# Patient Record
Sex: Female | Born: 1965
Health system: Southern US, Community
[De-identification: ages and names within clinical notes are randomized; demographics above are authoritative.]

## PROBLEM LIST (undated history)

## (undated) DIAGNOSIS — N939 Abnormal uterine and vaginal bleeding, unspecified: Secondary | ICD-10-CM

## (undated) DIAGNOSIS — R51 Headache: Secondary | ICD-10-CM

## (undated) DIAGNOSIS — Z8679 Personal history of other diseases of the circulatory system: Secondary | ICD-10-CM

## (undated) DIAGNOSIS — S83249A Other tear of medial meniscus, current injury, unspecified knee, initial encounter: Secondary | ICD-10-CM

## (undated) HISTORY — DX: Abnormal uterine and vaginal bleeding, unspecified: N93.9

## (undated) HISTORY — DX: Headache: R51

## (undated) HISTORY — PX: KNEE SURGERY: SHX244

## (undated) HISTORY — PX: BACK SURGERY: SHX140

## (undated) HISTORY — DX: Personal history of other diseases of the circulatory system: Z86.79

## (undated) HISTORY — PX: CHOLECYSTECTOMY: SHX55

## (undated) HISTORY — PX: APPENDECTOMY: SHX54

---

## 1999-09-29 ENCOUNTER — Ambulatory Visit (HOSPITAL_COMMUNITY): Admission: RE | Admit: 1999-09-29 | Discharge: 1999-09-29 | Payer: Self-pay | Admitting: Obstetrics and Gynecology

## 1999-09-29 ENCOUNTER — Encounter: Payer: Self-pay | Admitting: Obstetrics and Gynecology

## 2000-03-07 ENCOUNTER — Inpatient Hospital Stay (HOSPITAL_COMMUNITY): Admission: AD | Admit: 2000-03-07 | Discharge: 2000-03-09 | Payer: Self-pay | Admitting: *Deleted

## 2000-04-04 ENCOUNTER — Other Ambulatory Visit: Admission: RE | Admit: 2000-04-04 | Discharge: 2000-04-04 | Payer: Self-pay | Admitting: Obstetrics & Gynecology

## 2001-01-31 ENCOUNTER — Emergency Department (HOSPITAL_COMMUNITY): Admission: EM | Admit: 2001-01-31 | Discharge: 2001-01-31 | Payer: Self-pay | Admitting: Emergency Medicine

## 2001-02-05 ENCOUNTER — Encounter: Payer: Self-pay | Admitting: Emergency Medicine

## 2001-02-05 ENCOUNTER — Ambulatory Visit (HOSPITAL_COMMUNITY): Admission: RE | Admit: 2001-02-05 | Discharge: 2001-02-05 | Payer: Self-pay | Admitting: Emergency Medicine

## 2001-02-12 ENCOUNTER — Inpatient Hospital Stay (HOSPITAL_COMMUNITY): Admission: EM | Admit: 2001-02-12 | Discharge: 2001-02-13 | Payer: Self-pay

## 2001-02-12 ENCOUNTER — Encounter: Payer: Self-pay | Admitting: Surgery

## 2001-02-12 ENCOUNTER — Encounter (INDEPENDENT_AMBULATORY_CARE_PROVIDER_SITE_OTHER): Payer: Self-pay

## 2001-09-04 ENCOUNTER — Other Ambulatory Visit: Admission: RE | Admit: 2001-09-04 | Discharge: 2001-09-04 | Payer: Self-pay | Admitting: Obstetrics & Gynecology

## 2004-04-19 ENCOUNTER — Ambulatory Visit: Payer: Self-pay | Admitting: Internal Medicine

## 2004-04-20 ENCOUNTER — Encounter: Admission: RE | Admit: 2004-04-20 | Discharge: 2004-04-20 | Payer: Self-pay | Admitting: Internal Medicine

## 2004-08-03 ENCOUNTER — Ambulatory Visit: Payer: Self-pay | Admitting: Internal Medicine

## 2004-08-07 ENCOUNTER — Encounter: Admission: RE | Admit: 2004-08-07 | Discharge: 2004-08-07 | Payer: Self-pay | Admitting: Internal Medicine

## 2004-08-19 ENCOUNTER — Encounter: Admission: RE | Admit: 2004-08-19 | Discharge: 2004-08-19 | Payer: Self-pay | Admitting: Neurosurgery

## 2004-09-20 ENCOUNTER — Ambulatory Visit: Payer: Self-pay | Admitting: Family Medicine

## 2004-11-09 ENCOUNTER — Encounter: Admission: RE | Admit: 2004-11-09 | Discharge: 2004-11-09 | Payer: Self-pay | Admitting: Neurosurgery

## 2005-01-23 ENCOUNTER — Ambulatory Visit (HOSPITAL_COMMUNITY): Admission: RE | Admit: 2005-01-23 | Discharge: 2005-01-24 | Payer: Self-pay | Admitting: Neurosurgery

## 2006-05-01 ENCOUNTER — Ambulatory Visit: Payer: Self-pay | Admitting: Internal Medicine

## 2006-07-20 ENCOUNTER — Ambulatory Visit: Payer: Self-pay | Admitting: Internal Medicine

## 2006-07-20 LAB — CONVERTED CEMR LAB
BUN: 9 mg/dL (ref 6–23)
Basophils Relative: 0.7 % (ref 0.0–1.0)
Bilirubin, Direct: 0.1 mg/dL (ref 0.0–0.3)
CO2: 27 meq/L (ref 19–32)
Calcium: 8.7 mg/dL (ref 8.4–10.5)
Chloride: 107 meq/L (ref 96–112)
Creatinine, Ser: 0.8 mg/dL (ref 0.4–1.2)
Eosinophils Relative: 1.4 % (ref 0.0–5.0)
HDL: 37.4 mg/dL — ABNORMAL LOW (ref >39.0)
Hemoglobin: 13.6 g/dL (ref 12.0–15.0)
Lymphocytes Relative: 28.9 % (ref 12.0–46.0)
Monocytes Relative: 7.5 % (ref 3.0–11.0)
Neutro Abs: 4.5 10*3/uL (ref 1.4–7.7)
Neutrophils Relative %: 61.5 % (ref 43.0–77.0)
Platelets: 388 10*3/uL (ref 150–400)
Potassium: 4.4 meq/L (ref 3.5–5.1)
RBC: 4.43 M/uL (ref 3.87–5.11)
Total Protein: 6.8 g/dL (ref 6.0–8.3)
WBC: 7.3 10*3/uL (ref 4.5–10.5)

## 2006-07-21 ENCOUNTER — Encounter: Payer: Self-pay | Admitting: Internal Medicine

## 2006-07-23 DIAGNOSIS — K3189 Other diseases of stomach and duodenum: Secondary | ICD-10-CM

## 2006-07-23 DIAGNOSIS — R51 Headache: Secondary | ICD-10-CM

## 2006-07-23 DIAGNOSIS — R519 Headache, unspecified: Secondary | ICD-10-CM | POA: Insufficient documentation

## 2006-07-23 DIAGNOSIS — R1013 Epigastric pain: Secondary | ICD-10-CM

## 2006-07-26 ENCOUNTER — Encounter: Admission: RE | Admit: 2006-07-26 | Discharge: 2006-07-26 | Payer: Self-pay | Admitting: Internal Medicine

## 2006-08-02 ENCOUNTER — Telehealth (INDEPENDENT_AMBULATORY_CARE_PROVIDER_SITE_OTHER): Payer: Self-pay | Admitting: *Deleted

## 2006-08-03 ENCOUNTER — Telehealth (INDEPENDENT_AMBULATORY_CARE_PROVIDER_SITE_OTHER): Payer: Self-pay | Admitting: *Deleted

## 2006-08-03 ENCOUNTER — Ambulatory Visit: Payer: Self-pay | Admitting: Internal Medicine

## 2006-08-03 ENCOUNTER — Ambulatory Visit: Payer: Self-pay | Admitting: Cardiology

## 2006-08-03 LAB — CONVERTED CEMR LAB
Bilirubin Urine: NEGATIVE
Specific Gravity, Urine: 1.023 (ref 1.005–1.03)
Urine Glucose: NEGATIVE mg/dL
Urobilinogen, UA: 0.2 (ref 0.0–1.0)
WBC, UA: NONE SEEN cells/hpf (ref <3)
pH: 5.5 (ref 5.0–8.0)

## 2007-11-01 ENCOUNTER — Ambulatory Visit: Payer: Self-pay | Admitting: Internal Medicine

## 2008-01-27 ENCOUNTER — Telehealth (INDEPENDENT_AMBULATORY_CARE_PROVIDER_SITE_OTHER): Payer: Self-pay | Admitting: *Deleted

## 2008-01-28 ENCOUNTER — Ambulatory Visit: Payer: Self-pay | Admitting: Internal Medicine

## 2008-11-04 ENCOUNTER — Ambulatory Visit: Payer: Self-pay | Admitting: Diagnostic Radiology

## 2008-11-04 ENCOUNTER — Emergency Department (HOSPITAL_BASED_OUTPATIENT_CLINIC_OR_DEPARTMENT_OTHER): Admission: EM | Admit: 2008-11-04 | Discharge: 2008-11-04 | Payer: Self-pay | Admitting: Emergency Medicine

## 2009-01-05 ENCOUNTER — Encounter: Payer: Self-pay | Admitting: Internal Medicine

## 2010-01-28 ENCOUNTER — Encounter: Payer: Self-pay | Admitting: Internal Medicine

## 2010-04-05 NOTE — Letter (Signed)
Summary: Call a Nurse  Call a Nurse   Imported By: Lanelle Bal 02/09/2010 14:27:51  _____________________________________________________________________  External Attachment:    Type:   Image     Comment:   External Document

## 2010-06-10 LAB — CBC
Hemoglobin: 13.3 g/dL (ref 12.0–15.0)
MCV: 90.6 fL (ref 78.0–100.0)
Platelets: 360 10*3/uL (ref 150–400)
RBC: 4.21 MIL/uL (ref 3.87–5.11)

## 2010-06-10 LAB — POCT CARDIAC MARKERS
CKMB, poc: <1 ng/mL — ABNORMAL LOW (ref 1.0–8.0)
Myoglobin, poc: 44.3 ng/mL (ref 12–200)
Myoglobin, poc: 45 ng/mL (ref 12–200)
Troponin i, poc: <0.05 ng/mL (ref 0.00–0.09)

## 2010-06-10 LAB — BASIC METABOLIC PANEL
BUN: 15 mg/dL (ref 6–23)
CO2: 27 mEq/L (ref 19–32)
Calcium: 9.2 mg/dL (ref 8.4–10.5)
Chloride: 103 mEq/L (ref 96–112)
Creatinine, Ser: 0.9 mg/dL (ref 0.4–1.2)
GFR calc Af Amer: >60 mL/min (ref >60)
Glucose, Bld: 89 mg/dL (ref 70–99)
Sodium: 140 mEq/L (ref 135–145)

## 2010-06-10 LAB — DIFFERENTIAL
Basophils Absolute: 0.2 10*3/uL — ABNORMAL HIGH (ref 0.0–0.1)
Monocytes Relative: 7 % (ref 3–12)
Neutro Abs: 5.2 10*3/uL (ref 1.7–7.7)

## 2010-07-11 ENCOUNTER — Ambulatory Visit (INDEPENDENT_AMBULATORY_CARE_PROVIDER_SITE_OTHER): Payer: Managed Care, Other (non HMO) | Admitting: Family Medicine

## 2010-07-11 ENCOUNTER — Encounter: Payer: Self-pay | Admitting: *Deleted

## 2010-07-11 VITALS — BP 124/80 | Temp 98.8°F | Wt 294.4 lb

## 2010-07-11 DIAGNOSIS — J329 Chronic sinusitis, unspecified: Secondary | ICD-10-CM | POA: Insufficient documentation

## 2010-07-11 MED ORDER — GUAIFENESIN-CODEINE 100-10 MG/5ML PO SYRP: 5.0000 mL | ORAL_SOLUTION | Freq: Four times a day (QID) | ORAL | Status: AC | PRN

## 2010-07-11 MED ORDER — BENZONATATE 200 MG PO CAPS: 200.0000 mg | ORAL_CAPSULE | Freq: Three times a day (TID) | ORAL | Status: AC | PRN

## 2010-07-11 MED ORDER — AMOXICILLIN 875 MG PO TABS: 875.0000 mg | ORAL_TABLET | Freq: Two times a day (BID) | ORAL | Status: AC

## 2010-07-11 NOTE — Patient Instructions (Signed)
This is a sinus infection w/ a L ear infection Take the Amoxicillin as directed- take w/ food to avoid upset stomach Use the cough meds as needed- the pills won't make you drowsy, the syrup will Drink plenty of fluids Ibuprofen for pain and fever REST! Hang in there!

## 2010-07-11 NOTE — Assessment & Plan Note (Signed)
Pt's PE consistent w/ sinus infxn and likely OM on L.  Start Amox.  Cough meds prn.  Reviewed supportive care and red flags that should prompt return.  Pt expressed understanding and is in agreement w/ plan.

## 2010-07-11 NOTE — Progress Notes (Signed)
  Subjective:    Patient ID: Shelley Edwards, female    DOB: 08/18/1965, 45 y.o.   MRN: [id number]  HPI URI- sxs started 6 days ago w/ runny nose, nasal congestion.  This weekend developed L ear pain, HA, eye pain.  + facial pressure.  + cough- productive w/ post tussive emesis.  Subjective fever.  + sick contacts.                                                                                  Review of Systems For ROS see HPI     Objective:   Physical Exam  Constitutional: She appears well-developed and well-nourished. No distress.  HENT:  Head: Normocephalic and atraumatic.  Right Ear: Tympanic membrane normal.  Left Ear: Tympanic membrane is erythematous and retracted.  Nose: Mucosal edema and rhinorrhea present. Right sinus exhibits no maxillary sinus tenderness and no frontal sinus tenderness. Left sinus exhibits maxillary sinus tenderness and frontal sinus tenderness.  Mouth/Throat: Uvula is midline and mucous membranes are normal. Posterior oropharyngeal erythema present. No oropharyngeal exudate.  Eyes: Conjunctivae and EOM are normal. Pupils are equal, round, and reactive to light.  Neck: Normal range of motion. Neck supple.  Cardiovascular: Normal rate, regular rhythm and normal heart sounds.   Pulmonary/Chest: Effort normal and breath sounds normal. No respiratory distress. She has no wheezes.  Lymphadenopathy:    She has no cervical adenopathy.          Assessment & Plan:

## 2010-07-22 NOTE — Op Note (Signed)
Shelley Edwards, Shelley Edwards               ACCOUNT NO.:  [id number]   MEDICAL RECORD NO.:  [id number]          PATIENT TYPE:  AMB   LOCATION:  SDS                          FACILITY:  [facility]   PHYSICIAN:  Henry A. Pool, M.D.    DATE OF BIRTH:  August 07, 1965   DATE OF PROCEDURE:  01/23/2005  DATE OF DISCHARGE:                                 OPERATIVE REPORT   ATTENDING PHYSICIAN:  Sherilyn Cooter A. Pool, M.D.   SERVICE:  Neurosurgery.   PREOPERATIVE DIAGNOSES:  Left T10-T11 herniated nucleus pulposus with  myelopathy.  Left T11-T12 herniated nucleus pulposus with myelopathy.   POSTOPERATIVE DIAGNOSES:  Left T10-T11 herniated nucleus pulposus with  myelopathy.  Left T11-T12 herniated nucleus pulposus with myelopathy.   PROCEDURE NAME:  Left T10-T11 transpedicular microdiskectomy and left T11-  T12 transpedicular microdiskectomy.   ASSISTANT:  Yetta Barre.   ANESTHESIA:  General endotracheal.   INDICATIONS:  Ms. Scalici is a 45 year old female with history of back and  bilateral lower extremity pain and paresthesias consistent with a lower  thoracic myelopathy.  Workup demonstrated evidence of a large left-sided T10-  11 disc herniation with marked spinal cord compression as well as a  moderately large left paracentral disc herniation at T11-12 with spinal cord  compression.  The patient has been counseled as to her options.  She decided  to proceed with a two-level transpedicular microdiskectomy in hopes of  improving her symptoms.   OPERATIVE NOTE:  The patient was brought to the operating room and placed on  the operating table in supine position.  After __________  was achieved, the  patient was turned prone onto a Wilson frame, appropriately padding and  positioning __________  prepped and draped sterilely as was her thoracic  region.  A linear skin incision was then made over the T10, 11 and 12  levels.  This was carried down sharply in the midline.  Subperiosteal  dissection was then performed  exposing the lamina and facet joints of T10,  T11 and T12.  Deep self-retaining retractor was placed.  X-rays taken,  levels were confirmed.  Laminotomies were then performed using high-speed  drill and Kerrison rongeurs to remove the inferior aspect of the lamina  above, medial aspect of the facet joint and superior rim of the lamina  below.  The facetectomies were then carried out more widely at T10-11 and  T11-12.  The lamina of T11 was then completely resected using Kerrison  rongeurs on the patient's left side, performing a complete left  hemilaminectomy.  The pedicles of T11 and T12 were then drilled down flush  to the vertebral bodies.  Microscope was brought into the field to use  throughout the remainder of the diskectomy.  The pedicle was then further  drilled into the disc space and into the body itself.  The calcified disc  herniation was then pushed downward and then resected using pituitary  rongeurs.  This allowed removal of the disc herniation without having to  retract the thecal sac or spinal cord.  All the disc herniation at T10-11  was dissected this way quite easily.  The disc herniation at T11-12 was  somewhat more difficult as it was more midline.  This was gradually broken  off and pushed into the disc space cavity using Epstein curets and surgical  __________  dissectors.  After all the disc fragments had been completely  resected, the thecal sac and nerve roots were inspected.  They were free  from injury.  There was no residual compression.  The wound was then  irrigated with antibiotic solution.  Gelfoam was placed topically for  hemostasis and found to be good.  The microscope and dressings were removed.  Hemostasis was achieved with electrocautery.  The wound was then closed in  layers with Vicryl sutures.  Steri-Strips and a sterile dressing were  applied.  There were no operative complications.  The patient tolerated the  procedure well and she returned to  recovery postoperatively in good  condition.           ______________________________  Kathaleen Maser Pool, M.D.     HAP/MEDQ  D:  01/23/2005  T:  01/23/2005  Job:  [id number]

## 2010-07-22 NOTE — Op Note (Signed)
[facility]  Patient:    Shelley Edwards, Shelley Edwards                        MRN: [id number] Proc. Date: 03/07/00 Attending:  Caralyn Guile. Arlyce Dice, M.D.                           Operative Report  PREOPERATIVE DIAGNOSIS:       Suspected fetal macrosomia.  POSTOPERATIVE DIAGNOSIS:      Fetal macrosomia.  OPERATION:                    Primary low transverse cesarean section.  SURGEON:                      Richard D. Arlyce Dice, M.D.  ASSISTANT:                    Luvenia Redden, M.D.  ANESTHESIA:                   Spinal anesthesia.  ESTIMATED BLOOD LOSS:         500 cc.  FINDINGS:                     Female infant with Apgar scores 9 and 9, birth weight 10 pounds 6 ounces, clear amniotic fluid, normal-appearing tubes, ovaries and uterus.  INDICATION FOR PROCEDURE:     This is a 45 year old gravida 4, para 1, who presented to the office at [redacted] weeks gestation with closed cervix and a floating presenting part and an ultrasound estimated fetal weight of over 4500 grams.  The risks of induction or even spontaneous labor and delivery with a macrosomic baby were discussed with the patient who incidentally had a very difficult time delivering her first baby vaginally. Therefore, the patient and her husband were very agreeable to the option of preceding with primary cesarean section.  DESCRIPTION OF PROCEDURE:     The patient was taken to the operating room where spinal anesthesia was placed.  She was then placed in the supine position with left lateral displacement of the uterus. The abdomen was prepped and draped in sterile fashion.  The bladder was catheterized.  A low transverse incision was made and carried down to the fascia which was extended transversely. The rectus sheath was dissected from the overlying rectus muscle and then entered in the midline. The peritoneal cavity was then entered sharply and extended vertically.  The lower segment was then  identified. Incision was made through the lower segment to the amnion and then this was entered and the lower segment of the uterus was extended transversely with bandage scissors.  The infant was delivered with the aid of the kiwi vacuum. The cord bloods were obtained.  The placenta was then manually extracted.  The uterus was bluntly curettaged.  The lower segment was then closed with a single layer of running interlocking 0 Vicryl suture.  The peritoneum and rectus muscle were then closed in the midline with a running 0 Vicryl suture. The fascia was then closed with running 0 Vicryl suture.  Because of the depth of the subcutaneous tissue, interrupted 3-0 Vicryl sutures were used to close the dead space and then the skin was closed with staples.  The patient tolerated the procedure well and left the operating room in good condition. DD:  03/07/00 TD:  03/07/00  Job: [id number]

## 2010-07-22 NOTE — Discharge Summary (Signed)
[facility]  Patient:    Shelley Edwards, Shelley Edwards                        MRN: [id number] Adm. Date:  [id number] Disc. Date: [id number] Attending:  Lars Pinks Dictator:   Leilani Able, P.A.                           Discharge Summary  FINAL DIAGNOSES:              Intrauterine pregnancy at [redacted] weeks gestation, fetal macrosomia.  PROCEDURE:                    Primary low transverse cesarean section.  SURGEON:                      Richard D. Arlyce Dice, M.D.  ASSISTANT:                    Luvenia Redden, M.D.  COMPLICATIONS:                None.  HISTORY:                      This 45 year old G4, P1 presents to the office at [redacted] weeks gestation with a closed cervix and floating presenting part.  An ultrasound estimated the fetal weight over 4500 g.  The risks of induction even spontaneous labor and delivery were discussed with the patient. Therefore, the patient and her husband were agreeable to proceed with a cesarean section.  Patient was taken to the operating room by Dr. Ilda Mori where a primary low transverse cesarean section was performed with the delivery of a 10 pound 6 ounce female infant with Apgars of 8 and 9.  Delivery went without complications.  Patients postoperative course was benign without significant fever.  Patient was felt ready for discharge after postoperative day #2.  She was sent home on a regular diet, told to decrease activities, told to continue prenatal vitamins and FeSo4.  Was given Tylox #20 one to two q.4h. as needed for pain.  Told she could use over-the-counter pain medicines. Follow-up in the office in four weeks. DD:  04/04/00 TD:  04/04/00 Job: [id number]

## 2010-07-22 NOTE — Op Note (Signed)
[facility]  Patient:    Shelley Edwards, Shelley Edwards Visit Number: [id number] MRN: [id number]          Service Type: SUR Location: 4E 0401 01 Attending Physician:  Katha Cabal Dictated by:   [place] Daphine Deutscher, M.D. Proc. Date: 02/12/01 Admit Date:  02/12/2001                             Operative Report  PREOPERATIVE DIAGNOSIS:  Acute cholecystitis.  POSTOPERATIVE DIAGNOSIS:  Acute cholecystitis with normal intraoperative cholangiogram.  PROCEDURE:  Laparoscopic cholecystectomy with intraoperative cholangiogram.  DESCRIPTION OF PROCEDURE:  SURGEON:  Matthew B. Daphine Deutscher, M.D.  ASSISTANT:  Gita Kudo, M.D.  ANESTHESIA:  General endotracheal.  DESCRIPTION OF PROCEDURE:  Shelley Edwards is a 45 year old lady, native of [place], who I saw in the office on December 3 after an attack of right upper quadrant abdominal pain. Ultrasound revealed gallstones and sludge and she came to the office and informed consent was obtained regarding laparoscopic as well as open cholecystectomy.  The patient was taken to Room 11 on February 12, 2001 and given general anesthesia. Abdomen was prepped with Betadine and draped sterilely. She received one gram of Ancef preoperatively. The abdomen, after prepping and draping, made a longitudinal incision down into the umbilicus and through a pursestring suture I inserted Hasson cannula. The abdomen was insufflated and three trocars were placed in the upper abdomen. The gallbladder was markedly distended and edematous. It was decompressed and it contained dark black bile. There appeared to be a large stone impacted in the cystic duct. We elevated the gallbladder and I dissected out Calots triangle and found a very plump cystic duct. I put a clip up on the gallbladder side, incised the cystic duct, and inserted a Reddick catheter.  A dynamic cholangiogram revealed a fairly long cystic duct that came down and twisted  over the common duct with retrograde filling up into the intrahepatic radicals and then distal nice tapering and free flow into the duodenum. I then triple clipped the cystic duct, divided it, found a significant cystic artery right on the gallbladder which I triple clipped and divided and then in a very tedious manner, removed the gallbladder from the gallbladder bed. This was due to the marked amount of edema in the gallbladder wall and the fact that it was a very intrahepatic gallbladder. We maintained hemostasis throughout the dissection. No bowel leaks were noted, and once detached, the gallbladder was placed in a bag and brought out through the umbilicus with some difficulty and then I had to enlarge the incision significantly to get this out. Once removed, we went back and irrigated the gallbladder bed. No bleeding or bowel leaks were noted. I then repaired the umbilical defect under direct vision with the scope with interrupted simple sutures of 0 Vicryl and also with using the Endoclose device and a 0 Prolene. Once completed, I injected all the wound sites with Marcaine, and closed the skin with 4-0 Vicryl. Benzoin and Steri-Strips were used on the skin. The patient was awakened and taken to the recovery room in satisfactory condition.  FINAL DIAGNOSIS:  Acute cholecystitis, status post laparoscopic cholecystectomy. Dictated by:   [place] Daphine Deutscher, M.D. Attending Physician:  Katha Cabal DD:  02/12/01 TD:  02/12/01 Job: [id number]

## 2010-10-24 ENCOUNTER — Other Ambulatory Visit: Payer: Self-pay | Admitting: Orthopedic Surgery

## 2010-10-24 DIAGNOSIS — M25561 Pain in right knee: Secondary | ICD-10-CM

## 2010-11-08 ENCOUNTER — Other Ambulatory Visit: Payer: Managed Care, Other (non HMO)

## 2011-07-17 ENCOUNTER — Encounter: Payer: Managed Care, Other (non HMO) | Admitting: Internal Medicine

## 2011-08-08 ENCOUNTER — Encounter: Payer: Self-pay | Admitting: Internal Medicine

## 2011-08-08 ENCOUNTER — Ambulatory Visit (INDEPENDENT_AMBULATORY_CARE_PROVIDER_SITE_OTHER): Payer: BC Managed Care – PPO | Admitting: Internal Medicine

## 2011-08-08 VITALS — BP 136/88 | HR 85 | Temp 98.0°F | Wt 311.0 lb

## 2011-08-08 DIAGNOSIS — F419 Anxiety disorder, unspecified: Secondary | ICD-10-CM

## 2011-08-08 DIAGNOSIS — Z13220 Encounter for screening for lipoid disorders: Secondary | ICD-10-CM

## 2011-08-08 DIAGNOSIS — F411 Generalized anxiety disorder: Secondary | ICD-10-CM

## 2011-08-08 DIAGNOSIS — R079 Chest pain, unspecified: Secondary | ICD-10-CM

## 2011-08-08 DIAGNOSIS — R739 Hyperglycemia, unspecified: Secondary | ICD-10-CM

## 2011-08-08 DIAGNOSIS — R609 Edema, unspecified: Secondary | ICD-10-CM

## 2011-08-08 DIAGNOSIS — R7309 Other abnormal glucose: Secondary | ICD-10-CM

## 2011-08-08 DIAGNOSIS — E669 Obesity, unspecified: Secondary | ICD-10-CM

## 2011-08-08 LAB — LIPID PANEL: Triglycerides: 247 mg/dL — ABNORMAL HIGH (ref 0.0–149.0)

## 2011-08-08 LAB — CBC WITH DIFFERENTIAL/PLATELET
Basophils Relative: 0.7 % (ref 0.0–3.0)
Eosinophils Relative: 2.5 % (ref 0.0–5.0)
Hemoglobin: 12.4 g/dL (ref 12.0–15.0)
Lymphocytes Relative: 23.9 % (ref 12.0–46.0)
Lymphs Abs: 1.8 10*3/uL (ref 0.7–4.0)
Monocytes Absolute: 0.5 10*3/uL (ref 0.1–1.0)
Monocytes Relative: 7 % (ref 3.0–12.0)
Neutro Abs: 4.8 10*3/uL (ref 1.4–7.7)
Neutrophils Relative %: 65.9 % (ref 43.0–77.0)
RBC: 4.37 Mil/uL (ref 3.87–5.11)
RDW: 16.2 % — ABNORMAL HIGH (ref 11.5–14.6)
WBC: 7.3 10*3/uL (ref 4.5–10.5)

## 2011-08-08 LAB — COMPREHENSIVE METABOLIC PANEL
ALT: 34 U/L (ref 0–35)
Albumin: 3.4 g/dL — ABNORMAL LOW (ref 3.5–5.2)
Alkaline Phosphatase: 61 U/L (ref 39–117)
CO2: 26 mEq/L (ref 19–32)
Calcium: 8.5 mg/dL (ref 8.4–10.5)
Creatinine, Ser: 0.8 mg/dL (ref 0.4–1.2)
GFR: 84.83 mL/min (ref >60.00)
Potassium: 4.2 mEq/L (ref 3.5–5.1)
Total Bilirubin: 0.4 mg/dL (ref 0.3–1.2)

## 2011-08-08 LAB — HEMOGLOBIN A1C: Hgb A1c MFr Bld: 6.3 % (ref 4.6–6.5)

## 2011-08-08 LAB — TSH: TSH: 1.38 u[IU]/mL (ref 0.35–5.50)

## 2011-08-08 LAB — T3, FREE: T3, Free: 2.9 pg/mL (ref 2.3–4.2)

## 2011-08-08 NOTE — Patient Instructions (Signed)
Please get your x-ray at the other [facility]  office located at: [address], across from [facility].  Please go to the basement, this is a walk-in facility, they are open from 8:30 to 5:30 PM. Phone number [phone].  

## 2011-08-08 NOTE — Progress Notes (Signed)
Subjective:    Patient ID: Shelley Edwards, female    DOB: 08/22/1965, 46 y.o.   MRN: [id number]  HPI Here for a physical however she has multiple complaints. Will focus on her complaints and asked her to come back another day for a physical.  2 years history is insomnia, no problems falling asleep but she cannot stay asleep. Over-the-counter "Motrin nighttime" does help sometimes.  Also complains of joint aches, "every joint aches at different times"; denies headaches or weight loss.  Occasionally her hands are puffy (she points today to her knuckles, on exam they are not puffy)  Severe feet swelling at the end of the day.  She feels bloated all the time, not just after eating. Denies any nausea, vomiting. No diarrhea or change in the color of the stools. Very seldom she has GERD symptoms. Denies any postprandial right upper quadrant pain or epigastric pain. Occasionally pain at the left upper quadrant.  For the last 2 months she has been eating very healthy and is walking 3 miles daily despite that, she is unable to lose weight.  Finally she reports occasional chest pain, CP usually triggered by stress and being upset at work, pain is located in the middle of the chest, some  Radiation to the L shoulder, occ arm arm gets numb, sometimes associated with shortness of breath; gets sweatty, dizzy, no nauseous; pain may last 2 days sometimes, usually decreased after she takes aspirin.  Past Medical History: Headache Horseshoe  kidney per CT 2008 Fatty liver per CT 2008 BC-- none   Past Surgical History: knee surgery back surgery cholecystectomy  Social History: Married,  2 kids Tobacco-- rarely ETOH-- socially   Family History  MI-- F at age 59 Stroke-- GF GM Colon ca--no Breast ca--no  Review of Systems See history of present illness Denies fever or chills. Periods are regular. Admits to heavy snoring. Feels extremely fatigued some days, no actual inappropriate falling  asleep. She admits to anxiety and depression. Anxiety related to marital issues > obesity >  work , + depression, no suicidal ideas     Objective:   Physical Exam General -- alert, well-developed, and overweight appearing. No apparent distress.  Neck --no thyromegaly Lungs -- normal respiratory effort, no intercostal retractions, no accessory muscle use, and normal breath sounds.   Heart-- normal rate, regular rhythm, no murmur, and no gallop.   Abdomen--soft, non-tender, no distention, no masses, no HSM, no guarding, and no rigidity.   Extremities-- no pretibial edema bilaterally ; wrist and hand inspection and palpation with no synovitis Neurologic-- alert & oriented X3 and strength normal in all extremities. Psych-- Cognition and judgment appear intact. Alert and cooperative with normal attention span and concentration.  not anxious  but seems to be upset- angry in general     Assessment & Plan:   Presents today with multiple symptoms: Insomnia, responding well to over-the-counter medicines Joint aches, no evidence of synovitis on exam Lower extremity edema, no edema on today's exam, likely related to obesity Dyspepsia, will check for a CBC, may need further evaluation. Obesity and inability to lose weight: We had a  conversation about diet, she is reluctant to see a dietitian. "I've seen them twice, I ended up gaining weight", also recommend a calorie counting, "I'm following a 1500-calorie diet, I know how to count calories ". Eventually I recommend to read a book [employer]. Will order a TSH and also a T3 and T4 per patient request Chest pain,  will order a stress test and a chest x-ray Anxiety depression: we discussed personal and marital counseling. States her husband won't do  counseling, she does  has resources and is willing to set up personal counseling. We discussed medications and she is reluctant to take any. As far as question of  sleep apnea (snoring, fatigue), we  discussed possibly a sleep study, she says she's not ready to use any sort of device like a CPAP consequently we'll focus on weight loss.  Today , I spent more than 42 min with the patient, >50% of the time counseling and evaluating her multiple symptoms

## 2011-08-30 ENCOUNTER — Other Ambulatory Visit (HOSPITAL_COMMUNITY): Payer: BC Managed Care – PPO

## 2011-09-28 ENCOUNTER — Ambulatory Visit: Payer: BC Managed Care – PPO | Admitting: Internal Medicine

## 2012-03-13 ENCOUNTER — Encounter: Payer: Self-pay | Admitting: Internal Medicine

## 2012-03-13 ENCOUNTER — Ambulatory Visit (INDEPENDENT_AMBULATORY_CARE_PROVIDER_SITE_OTHER): Payer: BC Managed Care – PPO | Admitting: Internal Medicine

## 2012-03-13 VITALS — BP 130/82 | HR 88 | Temp 98.3°F | Wt 312.0 lb

## 2012-03-13 DIAGNOSIS — R109 Unspecified abdominal pain: Secondary | ICD-10-CM

## 2012-03-13 DIAGNOSIS — R0789 Other chest pain: Secondary | ICD-10-CM

## 2012-03-13 DIAGNOSIS — R071 Chest pain on breathing: Secondary | ICD-10-CM

## 2012-03-13 LAB — POCT URINALYSIS DIPSTICK
Bilirubin, UA: NEGATIVE
Leukocytes, UA: NEGATIVE
Spec Grav, UA: >=1.03
pH, UA: 5

## 2012-03-13 NOTE — Progress Notes (Signed)
  Subjective:    Patient ID: SHAHD OCCHIPINTI, female    DOB: 06-25-1965, 47 y.o.   MRN: [id number]  HPI Acute visit On and off pain in the left upper quadrant/left lower chest for the last 2 or 3 weeks. Symptoms are worse by lifting or doing home chores. Pain is not worse by eating. Ibuprofen and Tylenol offered little help. Denies any rash or injury. Will further questioning, she had flulike symptoms 3 weeks ago and she had severe cough x 2 weeks. Flu symptoms are now resolved.  Past Medical History: Headache Horseshoe  kidney per CT 2008 Fatty liver per CT 2008 BC-- none   Past Surgical History: knee surgery back surgery cholecystectomy  Social History: Married,  2 kids Tobacco-- rarely ETOH-- socially      Review of Systems No nausea, vomiting, diarrhea. No dysuria gross hematuria No recent airplane trips. No shortness of breath and again cough is resolved.     Objective:   Physical Exam  Abdominal:      General -- alert, well-developed  HEENT -- not pale or icteric  Lungs -- normal respiratory effort, no intercostal retractions, no accessory muscle use, and normal breath sounds.   Heart-- normal rate, regular rhythm, no murmur, and no gallop.   Abdomen--se graphic; no organomegaly, rest of the abdomen is soft and nontender. Extremities-- no pretibial edema bilaterally  Psych-- Cognition and judgment appear intact. Alert and cooperative with normal attention span and concentration.  not anxious appearing and not depressed appearing.      Assessment & Plan:

## 2012-03-13 NOTE — Assessment & Plan Note (Signed)
Left lower chest and left upper quadrant abdominal pain, given the location and history of recent intense cough i suspect a chest wall sprain or a rib fracture. When she came in we did a urine dip because of left upper abd quadrant pain, it showed trace of blood, UCX sent but I doubt she has a kidney stone or infex. Plan: Chest x-ray Motrin Wait x 2-3  weeks, will call if not improving

## 2012-03-13 NOTE — Patient Instructions (Addendum)
Get a chest XR at [facility], corner [address] (10 minutes form here); they are open 24/7 [address] [facility]  [place], [place] [address] [phone] ---- Motrin 200 mg 3 tablets every 6 hours as needed for pain. Always take it with food. Watch for stomach side effects (gastritis): nausea, stomach pain, change in the color of stools. Call if no better in 2-3 weeks

## 2012-03-15 LAB — URINE CULTURE: Colony Count: 60000

## 2012-03-21 ENCOUNTER — Encounter: Payer: Self-pay | Admitting: *Deleted

## 2014-01-05 ENCOUNTER — Encounter: Payer: Self-pay | Admitting: Internal Medicine

## 2014-06-24 ENCOUNTER — Ambulatory Visit (HOSPITAL_BASED_OUTPATIENT_CLINIC_OR_DEPARTMENT_OTHER)
Admission: RE | Admit: 2014-06-24 | Discharge: 2014-06-24 | Disposition: A | Discharge status: Home / Self Care | Payer: BLUE CROSS/BLUE SHIELD | Source: Ambulatory Visit | Attending: Internal Medicine | Admitting: Internal Medicine

## 2014-06-24 ENCOUNTER — Telehealth: Payer: Self-pay

## 2014-06-24 ENCOUNTER — Encounter: Payer: Self-pay | Admitting: Internal Medicine

## 2014-06-24 ENCOUNTER — Ambulatory Visit (INDEPENDENT_AMBULATORY_CARE_PROVIDER_SITE_OTHER): Payer: BLUE CROSS/BLUE SHIELD | Admitting: Internal Medicine

## 2014-06-24 ENCOUNTER — Telehealth: Payer: Self-pay | Admitting: *Deleted

## 2014-06-24 VITALS — BP 124/68 | HR 64 | Temp 98.1°F | Wt 281.0 lb

## 2014-06-24 DIAGNOSIS — R937 Abnormal findings on diagnostic imaging of other parts of musculoskeletal system: Secondary | ICD-10-CM | POA: Insufficient documentation

## 2014-06-24 DIAGNOSIS — R109 Unspecified abdominal pain: Secondary | ICD-10-CM

## 2014-06-24 DIAGNOSIS — R1010 Upper abdominal pain, unspecified: Secondary | ICD-10-CM | POA: Diagnosis present

## 2014-06-24 LAB — CBC WITH DIFFERENTIAL/PLATELET
BASOS ABS: 0 10*3/uL (ref 0.0–0.1)
BASOS PCT: 0 % (ref 0–1)
Eosinophils Absolute: 0.2 10*3/uL (ref 0.0–0.7)
Eosinophils Relative: 2 % (ref 0–5)
HCT: 35.2 % — ABNORMAL LOW (ref 36.0–46.0)
Hemoglobin: 11.5 g/dL — ABNORMAL LOW (ref 12.0–15.0)
LYMPHS PCT: 30 % (ref 12–46)
Lymphs Abs: 2.4 10*3/uL (ref 0.7–4.0)
MCH: 27.7 pg (ref 26.0–34.0)
MCHC: 32.7 g/dL (ref 30.0–36.0)
MCV: 84.8 fL (ref 78.0–100.0)
MPV: 8.7 fL (ref 8.6–12.4)
Monocytes Absolute: 0.8 10*3/uL (ref 0.1–1.0)
Monocytes Relative: 10 % (ref 3–12)
Neutro Abs: 4.6 10*3/uL (ref 1.7–7.7)
Neutrophils Relative %: 58 % (ref 43–77)
Platelets: 439 10*3/uL — ABNORMAL HIGH (ref 150–400)
RBC: 4.15 MIL/uL (ref 3.87–5.11)
RDW: 14.9 % (ref 11.5–15.5)
WBC: 8 10*3/uL (ref 4.0–10.5)

## 2014-06-24 LAB — POCT URINALYSIS DIPSTICK
Bilirubin, UA: NEGATIVE
GLUCOSE UA: NEGATIVE
Ketones, UA: NEGATIVE
Leukocytes, UA: NEGATIVE
Nitrite, UA: NEGATIVE
Protein, UA: NEGATIVE
Spec Grav, UA: >=1.03
UROBILINOGEN UA: 0.2
pH, UA: 6

## 2014-06-24 LAB — COMPREHENSIVE METABOLIC PANEL
ALK PHOS: 48 U/L (ref 39–117)
ALT: 16 U/L (ref 0–35)
AST: 15 U/L (ref 0–37)
Albumin: 3.7 g/dL (ref 3.5–5.2)
BILIRUBIN TOTAL: 0.3 mg/dL (ref 0.2–1.2)
BUN: 9 mg/dL (ref 6–23)
CO2: 24 mEq/L (ref 19–32)
Calcium: 8.5 mg/dL (ref 8.4–10.5)
Chloride: 106 mEq/L (ref 96–112)
Creat: 0.76 mg/dL (ref 0.50–1.10)
Glucose, Bld: 91 mg/dL (ref 70–99)
POTASSIUM: 3.9 meq/L (ref 3.5–5.3)
SODIUM: 137 meq/L (ref 135–145)
TOTAL PROTEIN: 6.8 g/dL (ref 6.0–8.3)

## 2014-06-24 LAB — POCT URINE PREGNANCY: Preg Test, Ur: NEGATIVE

## 2014-06-24 LAB — D-DIMER, QUANTITATIVE: D-Dimer, Quant: 0.35 ug/mL-FEU (ref 0.00–0.48)

## 2014-06-24 NOTE — Telephone Encounter (Signed)
Pt returning your call

## 2014-06-24 NOTE — Progress Notes (Signed)
Subjective:    Patient ID: Shelley Edwards, female    DOB: 08/30/1965, 49 y.o.   MRN: [id number]  DOS:  06/24/2014 Type of visit - description : Acute Visit Interval history:  Shelley Edwards is a 49 year old female presenting with a 5 day history of constant left upper> lower abdominal pain radiating  to the back with varying intensity peaking at pain 9/10 in severity.  She has noted that she feels slightly improved today from yesterday.  Similar but self limited episode 5 months ago.   She believes the pain is increased with eating and notes occasional bloating as well. She admits to occasional nausea as well (thinks due to pain). Patient has not taken any NSAIDs. Patient denies vomiting, diarrhea, constipation, or blood in stool. Also denies fever or chills.  She denies any vaginal discharge or vaginal bleeding.   She does mention a recent trip to [place] where she had an infection and was prescribed an unknown antibiotic which she took for five days. Also has noted leg swelling following the trip.  The swelling was bilateral, slightly worse in the left which is not unusual whenever she has swelling. No calf pain per se  Review of Systems  Constitutional: No fever, chills.  Respiratory: No difficulty breathing. Post-URI dry cough Cardiovascular: No chest pain GI: As per HPI  GU: No dysuria, gross hematuria, difficulty urinating. No urinary urgency or frequency. Last menstrual period about a week ago.   Neurological: No dizziness or syncope. No headaches.  Denies any rash at the area of pain   Past Medical History  Diagnosis Date  . WUJWJXBJ(478.2)     Past Surgical History  Procedure Laterality Date  . Knee surgery    . Back surgery      History   Social History  . Marital Status: Married    Spouse Name: N/A  . Number of Children: N/A  . Years of Education: N/A   Occupational History  . Not on file.   Social History Main Topics  . Smoking status: Light Tobacco  Smoker  . Smokeless tobacco: Not on file  . Alcohol Use: 0.0 oz/week    0 Standard drinks or equivalent per week  . Drug Use: No  . Sexual Activity: Not on file   Other Topics Concern  . Not on file   Social History Narrative        Medication List    Notice  As of 06/24/2014 11:59 PM   You have not been prescribed any medications.         Objective:   Physical Exam  Abdominal:     BP 124/68 mmHg  Pulse 64  Temp(Src) 98.1 F (36.7 C) (Oral)  Wt 281 lb (127.461 kg)  SpO2 97%  LMP 06/19/2014 (Approximate)  General:   Well developed, well nourished . NAD.  HEENT:  Normocephalic . Face symmetric, atraumatic Lungs:  CTA B Normal respiratory effort, no intercostal retractions, no accessory muscle use. Heart: RRR,  no murmur.  Abdomen:  Not distended , soft. Tender to palpation around the left side of abdomen. No rebound or rigidity. No mass,organomegaly Muscle skeletal: Pretibial edema bilaterally, left calf (20" circumference) slightly more swollen than right (19 1/3" circumference). Calves not tender to palpation Skin: Not pale. Not jaundice. Neurologic:  alert & oriented X3.  Speech normal, gait appropriate for age and unassisted Psych--  Cognition and judgment appear intact.  Cooperative with normal attention span and concentration.  Behavior appropriate.  No anxious or depressed appearing.      Assessment & Plan:    Left abd pain  Due to location, character, and severity of pain, our DDx includes -- diverticulitis, UTI,  --Given recent airplane trip and leg swelling I am also somewhat concerned about a PE-DVT. --Again given the location of the pain a PNM  is in the differential --Gyn issues also in the differential  PLAN: UPT neg Urinalysis revealed trace blood in urine increasing likelihood of UTI.  We will check CBC and CMP to evaluate abdominal etiologies such as diverticulitis  UCx to work up UTI CXR and D-dimer to evaluate potential  pulmonary etiologies.  Further advise w/ results,  Consider CT   Addendum: cxr normal, d-dimer negative. Plan: CT for further eval

## 2014-06-24 NOTE — Telephone Encounter (Signed)
Spoke with Shelley Edwards, informed her of lab results. Informed her that Urine culture is still pending may take 1 to 2 days, but that Dr. Drue NovelPaz would like for her to have CT of Abdomen, Shelley Edwards verbalized understanding. Informed her that she should hear from radiology to scheduled an appt for the CT sometime tonight but probably tomorrow. Shelley Edwards verbalized understanding.

## 2014-06-24 NOTE — Progress Notes (Signed)
Pre visit review using our clinic review tool, if applicable. No additional management support is needed unless otherwise documented below in the visit note. 

## 2014-06-24 NOTE — Telephone Encounter (Signed)
LMOM informing Pt to return call regarding having CT scan completed.

## 2014-06-24 NOTE — Patient Instructions (Signed)
Get your blood work before you leave   Stop by the first floor and get the XR   We will call you with the results

## 2014-06-24 NOTE — Telephone Encounter (Signed)
[facility] requesting fax number to fax stat labs.  Fax number given.

## 2014-06-25 ENCOUNTER — Encounter (HOSPITAL_BASED_OUTPATIENT_CLINIC_OR_DEPARTMENT_OTHER): Payer: Self-pay

## 2014-06-25 ENCOUNTER — Ambulatory Visit (HOSPITAL_BASED_OUTPATIENT_CLINIC_OR_DEPARTMENT_OTHER)
Admission: RE | Admit: 2014-06-25 | Discharge: 2014-06-25 | Disposition: A | Discharge status: Home / Self Care | Payer: BLUE CROSS/BLUE SHIELD | Source: Ambulatory Visit | Attending: Internal Medicine | Admitting: Internal Medicine

## 2014-06-25 DIAGNOSIS — R1012 Left upper quadrant pain: Secondary | ICD-10-CM | POA: Insufficient documentation

## 2014-06-25 DIAGNOSIS — R109 Unspecified abdominal pain: Secondary | ICD-10-CM

## 2014-06-25 DIAGNOSIS — Q631 Lobulated, fused and horseshoe kidney: Secondary | ICD-10-CM | POA: Diagnosis not present

## 2014-06-25 DIAGNOSIS — R110 Nausea: Secondary | ICD-10-CM | POA: Diagnosis not present

## 2014-06-25 MED ORDER — IOHEXOL 300 MG/ML  SOLN
100.0000 mL | Freq: Once | INTRAMUSCULAR | Status: AC | PRN
  Administered 2014-06-25: 100 mL via INTRAVENOUS

## 2014-06-26 ENCOUNTER — Telehealth: Payer: Self-pay | Admitting: Internal Medicine

## 2014-06-26 LAB — URINE CULTURE
COLONY COUNT: NO GROWTH
ORGANISM ID, BACTERIA: NO GROWTH

## 2014-06-26 NOTE — Telephone Encounter (Signed)
I spoke with the patient, she is feeling about the same, the pain is still there, steady, denies fever or diarrhea. After our conversation we agreed on the following: ER during the weekend if  Symptoms increase or if she has  fever, chills, chest pain or difficulty breathing. Patient will call later and set up an appointment for next week for a recheck.

## 2014-06-26 NOTE — Telephone Encounter (Signed)
Spoke with Pt, informed her of remaining lab results and CT scan. She informed me that she is still having pain, and is getting frustrated that we can't find out what is wrong. Pt states the pain has been going on for a week now and couldn't remember if she told Dr. Drue NovelPaz that this is not the first time she has had this pain but that the pain is worse this time. Informed her I would inform Dr. Drue NovelPaz of this information and we will see where we need to go from here. Pt verbalized understanding and was "happy that she guesses she isn't going to die today."

## 2014-06-26 NOTE — Telephone Encounter (Signed)
D-dimer, white count, UCX, CT of the abdomen :  Normal- negative. No evidence of  infection at this point. Please call the patient, ask  how she is doing and let me know.

## 2014-06-30 ENCOUNTER — Other Ambulatory Visit: Payer: Self-pay | Admitting: Internal Medicine

## 2014-06-30 DIAGNOSIS — R9389 Abnormal findings on diagnostic imaging of other specified body structures: Secondary | ICD-10-CM

## 2014-07-07 ENCOUNTER — Telehealth: Payer: Self-pay | Admitting: Internal Medicine

## 2014-07-07 NOTE — Telephone Encounter (Signed)
Caller name:Eynon, Vernona RiegerLaura Relation to UJ:WJXBpt:self Call back [phone] Pharmacy:  Reason for call: pt states she received a letter stating that the office has been trying to contact her in regards to radiology results. Pt states she has not received any calls from [facility], states she has spoken with you previous regarding the results and she was under the impression that everything was good, would like for you to call her back.

## 2014-07-08 NOTE — Telephone Encounter (Signed)
LMOM informing Pt to return call.  

## 2014-09-08 ENCOUNTER — Telehealth: Payer: Self-pay | Admitting: Internal Medicine

## 2014-09-08 NOTE — Telephone Encounter (Signed)
LMOM informing Pt to return call.  

## 2014-09-08 NOTE — Telephone Encounter (Addendum)
Please advise patient, for completeness we need to do a scapula x-ray even if she is now feeling well (Chest x-ray few weeks ago showed a scapula abnormality)

## 2014-09-15 NOTE — Telephone Encounter (Signed)
Unable to contact Pt, have previously sent letter informing Pt to call office. Pt returned call after receiving letter, but I was unable to speak with her at that time. I returned Pt's call and have been unable to contact her since. I can send another letter in regards to the scapula x-ray in hopes she has the x-ray completed.

## 2014-09-15 NOTE — Telephone Encounter (Signed)
We will discuss when she comes back to the office

## 2015-07-05 DIAGNOSIS — N939 Abnormal uterine and vaginal bleeding, unspecified: Secondary | ICD-10-CM

## 2015-07-05 HISTORY — DX: Abnormal uterine and vaginal bleeding, unspecified: N93.9

## 2015-07-20 ENCOUNTER — Encounter: Payer: Self-pay | Admitting: Internal Medicine

## 2015-07-20 ENCOUNTER — Emergency Department (HOSPITAL_BASED_OUTPATIENT_CLINIC_OR_DEPARTMENT_OTHER): Payer: BLUE CROSS/BLUE SHIELD

## 2015-07-20 ENCOUNTER — Ambulatory Visit (INDEPENDENT_AMBULATORY_CARE_PROVIDER_SITE_OTHER): Payer: BLUE CROSS/BLUE SHIELD | Admitting: Internal Medicine

## 2015-07-20 ENCOUNTER — Emergency Department (HOSPITAL_BASED_OUTPATIENT_CLINIC_OR_DEPARTMENT_OTHER)
Admission: EM | Admit: 2015-07-20 | Discharge: 2015-07-20 | Disposition: A | Discharge status: Home / Self Care | Payer: BLUE CROSS/BLUE SHIELD | Attending: Emergency Medicine | Admitting: Emergency Medicine

## 2015-07-20 ENCOUNTER — Encounter (HOSPITAL_BASED_OUTPATIENT_CLINIC_OR_DEPARTMENT_OTHER): Payer: Self-pay

## 2015-07-20 VITALS — BP 134/84 | HR 98 | Temp 98.4°F | Wt 286.2 lb

## 2015-07-20 DIAGNOSIS — F17200 Nicotine dependence, unspecified, uncomplicated: Secondary | ICD-10-CM | POA: Insufficient documentation

## 2015-07-20 DIAGNOSIS — R109 Unspecified abdominal pain: Secondary | ICD-10-CM | POA: Diagnosis not present

## 2015-07-20 DIAGNOSIS — Z09 Encounter for follow-up examination after completed treatment for conditions other than malignant neoplasm: Secondary | ICD-10-CM

## 2015-07-20 LAB — POCT URINALYSIS DIPSTICK
Bilirubin, UA: NEGATIVE
GLUCOSE UA: NEGATIVE
Ketones, UA: NEGATIVE
LEUKOCYTES UA: NEGATIVE
Nitrite, UA: NEGATIVE
Protein, UA: NEGATIVE
Spec Grav, UA: 1.02
Urobilinogen, UA: 0.2
pH, UA: 5

## 2015-07-20 LAB — BASIC METABOLIC PANEL
ANION GAP: 7 (ref 5–15)
BUN: 12 mg/dL (ref 6–20)
CALCIUM: 8.8 mg/dL — AB (ref 8.9–10.3)
CO2: 26 mmol/L (ref 22–32)
Chloride: 103 mmol/L (ref 101–111)
Creatinine, Ser: 0.81 mg/dL (ref 0.44–1.00)
Glucose, Bld: 89 mg/dL (ref 65–99)
POTASSIUM: 3.9 mmol/L (ref 3.5–5.1)
Sodium: 136 mmol/L (ref 135–145)

## 2015-07-20 LAB — CBC
HCT: 38.9 % (ref 36.0–46.0)
HEMOGLOBIN: 13.2 g/dL (ref 12.0–15.0)
MCH: 30.7 pg (ref 26.0–34.0)
MCHC: 33.9 g/dL (ref 30.0–36.0)
MCV: 90.5 fL (ref 78.0–100.0)
Platelets: 392 10*3/uL (ref 150–400)
RBC: 4.3 MIL/uL (ref 3.87–5.11)
RDW: 14.6 % (ref 11.5–15.5)
WBC: 10.3 10*3/uL (ref 4.0–10.5)

## 2015-07-20 LAB — POCT URINE PREGNANCY: PREG TEST UR: NEGATIVE

## 2015-07-20 MED ORDER — HYDROCODONE-ACETAMINOPHEN 5-325 MG PO TABS: 1.0000 | ORAL_TABLET | Freq: Four times a day (QID) | ORAL | Status: DC | PRN

## 2015-07-20 MED ORDER — KETOROLAC TROMETHAMINE 30 MG/ML IJ SOLN
30.0000 mg | Freq: Once | INTRAMUSCULAR | Status: AC
  Administered 2015-07-20: 30 mg via INTRAVENOUS
  Filled 2015-07-20: qty 1

## 2015-07-20 MED FILL — HYDROCODON-APAP 5-325: 5-325 | 2 days supply | Qty: 15 | Fill #0

## 2015-07-20 NOTE — Progress Notes (Signed)
Pre visit review using our clinic review tool, if applicable. No additional management support is needed unless otherwise documented below in the visit note. 

## 2015-07-20 NOTE — Discharge Instructions (Signed)
Ibuprofen 600 mg 3 times daily for the next 5 days.  Hydrocodone as prescribed as needed for pain not relieved with ibuprofen.  Return to the ER if symptoms significant only worsen or change.   Flank Pain Flank pain refers to pain that is located on the side of the body between the upper abdomen and the back. The pain may occur over a short period of time (acute) or may be long-term or reoccurring (chronic). It may be mild or severe. Flank pain can be caused by many things. CAUSES  Some of the more common causes of flank pain include:  Muscle strains.   Muscle spasms.   A disease of your spine (vertebral disk disease).   A lung infection (pneumonia).   Fluid around your lungs (pulmonary edema).   A kidney infection.   Kidney stones.   A very painful skin rash caused by the chickenpox virus (shingles).   Gallbladder disease.  HOME CARE INSTRUCTIONS  Home care will depend on the cause of your pain. In general,  Rest as directed by your caregiver.  Drink enough fluids to keep your urine clear or pale yellow.  Only take over-the-counter or prescription medicines as directed by your caregiver. Some medicines may help relieve the pain.  Tell your caregiver about any changes in your pain.  Follow up with your caregiver as directed. SEEK IMMEDIATE MEDICAL CARE IF:   Your pain is not controlled with medicine.   You have new or worsening symptoms.  Your pain increases.   You have abdominal pain.   You have shortness of breath.   You have persistent nausea or vomiting.   You have swelling in your abdomen.   You feel faint or pass out.   You have blood in your urine.  You have a fever or persistent symptoms for more than 2-3 days.  You have a fever and your symptoms suddenly get worse. MAKE SURE YOU:   Understand these instructions.  Will watch your condition.  Will get help right away if you are not doing well or get worse.   This  information is not intended to replace advice given to you by your health care provider. Make sure you discuss any questions you have with your health care provider.   Document Released: 04/13/2005 Document Revised: 11/15/2011 Document Reviewed: 10/05/2011 Elsevier Interactive Patient Education [employer]2016 Elsevier Inc.

## 2015-07-20 NOTE — Assessment & Plan Note (Signed)
50 year old female with history of cholecystectomy, appendectomy and previous C-section presents with right back/flank pain with some radiation anteriorly. She is having severe vaginal bleeding on and off for the last few weeks, previously only had irregular bleeding. Not on birth control pills, uses condoms with her husband. UA  + blood Urine pregnancy test (-) Based on above, the likely diagnosis is a kidney stone however other DXs are also possible. She needs further eval. I spoke with the staff at the ER downstairs and they will see the patient.

## 2015-07-20 NOTE — ED Notes (Addendum)
Right flank pain since Sunday-sent from PCP office in the building- NAD-steady gait-pt adds vaginal bleeding x 1 month and has appt with GYN this week

## 2015-07-20 NOTE — ED Provider Notes (Signed)
CSN: [id number]     Arrival date & time 07/20/15  1350 History   First MD Initiated Contact with Patient 07/20/15 1409     Chief Complaint  Patient presents with  . Flank Pain     (Consider location/radiation/quality/duration/timing/severity/associated sxs/prior Treatment) HPI Comments: Patient is a 50 year old female who presents with complaints of right flank pain. She was just evaluated in the [facility] clinic upstairs and sent here for further evaluation. This pain has been present for the past 2 days and is worsening. She denies any fevers or chills. She denies any nausea or vomiting. She does report seeing some blood in her urine, however she is currently on her menstrual period. She had a urinalysis upstairs are formed which revealed blood. Pregnancy test was negative.  Patient is a 50 y.o. female presenting with flank pain. The history is provided by the patient.  Flank Pain This is a new problem. The current episode started 2 days ago. The problem occurs constantly. The problem has been gradually worsening. Nothing aggravates the symptoms. Nothing relieves the symptoms. She has tried nothing for the symptoms. The treatment provided no relief.    Past Medical History  Diagnosis Date  . WUJWJXBJ(478.2)    Past Surgical History  Procedure Laterality Date  . Knee surgery    . Back surgery    . Cholecystectomy  ~2002  . Appendectomy  at age 84   Family History  Problem Relation Age of Onset  . Arthritis    . Diabetes Mother   . Melanoma Mother    Social History  Substance Use Topics  . Smoking status: Current Some Day Smoker  . Smokeless tobacco: None  . Alcohol Use: Yes     Comment: occ   OB History    Gravida Para Term Preterm AB TAB SAB Ectopic Multiple Living   2 2             Review of Systems  Genitourinary: Positive for flank pain.  All other systems reviewed and are negative.     Allergies  Review of patient's allergies indicates no known  allergies.  Home Medications   Prior to Admission medications   Not on File   BP 129/81 mmHg  Pulse 64  Temp(Src) 99.3 F (37.4 C) (Oral)  Resp 20  Ht  (1.753 m)  Wt 286 lb (129.729 kg)  BMI 42.22 kg/m2  SpO2 99%  LMP 06/20/2015 Physical Exam  Constitutional: She is oriented to person, place, and time. She appears well-developed and well-nourished. No distress.  HENT:  Head: Normocephalic and atraumatic.  Neck: Normal range of motion. Neck supple.  Cardiovascular: Normal rate and regular rhythm.  Exam reveals no gallop and no friction rub.   No murmur heard. Pulmonary/Chest: Effort normal and breath sounds normal. No respiratory distress. She has no wheezes.  Abdominal: Soft. Bowel sounds are normal. She exhibits no distension. There is no tenderness.  There is mild tenderness in the right flank.  Musculoskeletal: Normal range of motion.  Neurological: She is alert and oriented to person, place, and time.  Skin: Skin is warm and dry. She is not diaphoretic.  Nursing note and vitals reviewed.   ED Course  Procedures (including critical care time) Labs Review Labs Reviewed  BASIC METABOLIC PANEL  CBC    Imaging Review No results found. I have personally reviewed and evaluated these images and lab results as part of my medical decision-making.    MDM   Final diagnoses:  Right  flank pain    Patient presents with complaints of right flank pain. Her urinalysis reveals blood, however she is menstruating. CT reveals no S4 renal calculus or other acute pathology. She is given Toradol appears to be feeling better. She will be discharged with anti-inflammatories, hydrocodone, when necessary return.    Geoffery Lyonsouglas Bianey Tesoro, MD 07/20/15 [phone]

## 2015-07-20 NOTE — Progress Notes (Signed)
Subjective:    Patient ID: Shelley ManchesterLaura C Edwards, female    DOB: January 30, 1966, 50 y.o.   MRN: [id number]  DOS:  07/20/2015 Type of visit - description : Acute Interval history:  Symptoms started 2 days ago at around 7 PM with intense pain at the right lower back/flank, pain is steady, radiates anteriorly to the right lower quadrant.  Denies any lower extremity paresthesias.  Also, for the last 5 weeks she is having heavy vaginal bleeding on and off, sometimes 2 or 3 days at a time. Has an appointment to see a gynecologist soon. Not on BCP, contraception with condoms only.    Review of Systems Denies chills, some subjective fever yesterday? Appetite is decreased + Nausea last night, no vomiting, no constipation, reports BMs are normal. No cough No rash on the abdomen or flank. No dysuria, gross hematuria difficulty urinating.   Past Medical History  Diagnosis Date  . WUJWJXBJ(478.2Headache(784.0)     Past Surgical History  Procedure Laterality Date  . Knee surgery    . Back surgery    . Cholecystectomy  ~2002  . Appendectomy  at age 50    Social History   Social History  . Marital Status: Married    Spouse Name: N/A  . Number of Children: N/A  . Years of Education: N/A   Occupational History  . Not on file.   Social History Main Topics  . Smoking status: Current Some Day Smoker  . Smokeless tobacco: Not on file  . Alcohol Use: Yes     Comment: occ  . Drug Use: No  . Sexual Activity: Yes    Birth Control/ Protection: Condom   Other Topics Concern  . Not on file   Social History Narrative        Medication List    Notice  As of 07/20/2015  2:03 PM   You have not been prescribed any medications.         Objective:   Physical Exam  Musculoskeletal:       Back:   BP 134/84 mmHg  Pulse 98  Temp(Src) 98.4 F (36.9 C) (Oral)  Wt 286 lb 4 oz (129.842 kg)  SpO2 96%  LMP 06/21/2015 (Exact Date) General:   Well developed, well nourished. Seems to be in moderate  distress, unable to stay in one place, pacing the room.Marland Kitchen.  HEENT:  Normocephalic . Face symmetric, atraumatic Lungs:  CTA B Normal respiratory effort, no intercostal retractions, no accessory muscle use. Heart: RRR,  no murmur.  no pretibial edema bilaterally  Abdomen:  Not distended, soft, non-tender. No rebound or rigidity.  MSK: No TTP at the lumbosacral spine. Skin: Not pale. Not jaundice Neurologic:  alert & oriented X3.  Speech normal, gait appropriate for age and unassisted. Straight leg test negative. Strength symmetric Psych--  Cognition and judgment appear intact.  Cooperative with normal attention span and concentration.  Behavior appropriate. No anxious or depressed appearing.n    Assessment & Plan:   50 year old female with history of cholecystectomy, appendectomy and previous C-section presents with right back/flank pain with some radiation anteriorly. She is having severe vaginal bleeding on and off for the last few weeks, previously only had irregular bleeding. Not on birth control pills, uses condoms with her husband. UA  + blood Urine pregnancy test (-) Based on above, the likely diagnosis is a kidney stone however other DXs are also possible. She needs further eval. I spoke with the staff at the ER downstairs and  they will see the patient.

## 2015-07-20 NOTE — Progress Notes (Signed)
Pt escorted to [facility] per Dr. Drue NovelPaz. Remained w/ pt through check-in process.

## 2015-07-20 NOTE — Patient Instructions (Signed)
Please go to the ER downstairs now. I already talked with the staff there

## 2015-07-22 ENCOUNTER — Other Ambulatory Visit: Payer: Self-pay | Admitting: Obstetrics & Gynecology

## 2015-07-26 ENCOUNTER — Telehealth: Payer: Self-pay

## 2015-07-26 LAB — CYTOLOGY - PAP

## 2015-07-26 NOTE — Telephone Encounter (Signed)
LMOM informing Pt to return call to let us know how she is doing since OV and ED visit last week.

## 2015-11-17 ENCOUNTER — Encounter: Payer: Self-pay | Admitting: Internal Medicine

## 2015-11-17 ENCOUNTER — Ambulatory Visit (HOSPITAL_BASED_OUTPATIENT_CLINIC_OR_DEPARTMENT_OTHER)
Admission: RE | Admit: 2015-11-17 | Discharge: 2015-11-17 | Disposition: A | Discharge status: Home / Self Care | Payer: BLUE CROSS/BLUE SHIELD | Source: Ambulatory Visit | Attending: Internal Medicine | Admitting: Internal Medicine

## 2015-11-17 ENCOUNTER — Ambulatory Visit (INDEPENDENT_AMBULATORY_CARE_PROVIDER_SITE_OTHER): Payer: BLUE CROSS/BLUE SHIELD | Admitting: Internal Medicine

## 2015-11-17 VITALS — BP 122/78 | HR 84 | Temp 98.1°F | Resp 14 | Ht 69.0 in | Wt 283.5 lb

## 2015-11-17 DIAGNOSIS — M25572 Pain in left ankle and joints of left foot: Secondary | ICD-10-CM | POA: Diagnosis not present

## 2015-11-17 DIAGNOSIS — M7732 Calcaneal spur, left foot: Secondary | ICD-10-CM | POA: Diagnosis not present

## 2015-11-17 DIAGNOSIS — M7989 Other specified soft tissue disorders: Secondary | ICD-10-CM | POA: Diagnosis not present

## 2015-11-17 NOTE — Patient Instructions (Signed)
  STOP BY THE FIRST FLOOR:  get the XR   Continue ice and ibuprofen

## 2015-11-17 NOTE — Progress Notes (Signed)
Pre visit review using our clinic review tool, if applicable. No additional management support is needed unless otherwise documented below in the visit note. 

## 2015-11-17 NOTE — Progress Notes (Signed)
   Subjective:    Patient ID: Shelley Edwards, female    DOB: Apr 21, 1965, 50 y.o.   MRN: [id number]  DOS:  11/17/2015 Type of visit - description : Acute  Interval history: Symptoms started 6 weeks ago with pain and swelling at the medial ask the left ankle. She has been using ibuprofen and ice without much help. Denies any redness or warmness. Does not recall any injury or rash. She is okay when she walks on a flat surface but when she goes upstairs is more painful   Review of Systems   Past Medical History:  Diagnosis Date  . Abnormal uterine bleeding 07/2015  . WUJWJXBJ(478.2Headache(784.0)     Past Surgical History:  Procedure Laterality Date  . APPENDECTOMY  at age [age]  . BACK SURGERY    . CHOLECYSTECTOMY  ~2002  . KNEE SURGERY      Social History   Social History  . Marital status: Married    Spouse name: N/A  . Number of children: N/A  . Years of education: N/A   Occupational History  . Not on file.   Social History Main Topics  . Smoking status: Current Some Day Smoker  . Smokeless tobacco: Not on file  . Alcohol use Yes     Comment: occ  . Drug use: No  . Sexual activity: Yes    Birth control/ protection: Condom   Other Topics Concern  . Not on file   Social History Narrative  . No narrative on file        Medication List       Accurate as of 11/17/15  4:17 PM. Always use your most recent med list.          HYDROcodone-acetaminophen 5-325 MG tablet Commonly known as:  NORCO Take 1-2 tablets by mouth every 6 (six) hours as needed.          Objective:   Physical Exam BP 122/78 (BP Location: Left Arm, Patient Position: Sitting, Cuff Size: Normal)   Pulse 84   Temp 98.1 F (36.7 C) (Oral)   Resp 14   Ht 5\' 9"  (1.753 m)   Wt 283 lb 8 oz (128.6 kg)   LMP 10/25/2015 (Approximate)   SpO2 98%   BMI 41.87 kg/m  General:   Well developed, well nourished . NAD.  HEENT:  Normocephalic . Face symmetric, atraumatic Lower extremities: Right ankle:  Normal to inspection, palpation. Good pedal pulse Left ankle: Mild swelling at the internal malleolus, minimal TTP slightly distal from the malleolus. Range of motion of the ankle is normal. Good pedal pulse Skin: Not pale. Not jaundice Neurologic:  alert & oriented X3.  Speech normal, gait appropriate for age and unassisted Psych--  Cognition and judgment appear intact.  Cooperative with normal attention span and concentration.  Behavior appropriate. No anxious or depressed appearing.      Assessment & Plan:   L ankle pain and swelling: Sx as above , not responding to 6 weeks of ice and ibuprofen. Will get a x-ray and refer to sports medicine. Doubt gout given the lack of warmness and redness but that is in the differential.

## 2015-11-18 NOTE — Assessment & Plan Note (Signed)
L ankle pain and swelling: Sx as above , not responding to 6 weeks of ice and ibuprofen. Will get a x-ray and refer to sports medicine. Doubt gout given the lack of warmness and redness but that is in the differential.

## 2015-11-19 ENCOUNTER — Ambulatory Visit: Payer: BLUE CROSS/BLUE SHIELD | Admitting: Family Medicine

## 2015-11-22 ENCOUNTER — Encounter: Payer: Self-pay | Admitting: Family Medicine

## 2015-11-22 ENCOUNTER — Ambulatory Visit (INDEPENDENT_AMBULATORY_CARE_PROVIDER_SITE_OTHER): Payer: BLUE CROSS/BLUE SHIELD | Admitting: Family Medicine

## 2015-11-22 DIAGNOSIS — M25572 Pain in left ankle and joints of left foot: Secondary | ICD-10-CM

## 2015-11-22 MED ORDER — PREDNISONE 10 MG PO TABS: ORAL_TABLET | ORAL | 0 refills | Status: DC

## 2015-11-22 NOTE — Patient Instructions (Signed)
Your pain, swelling (including what was seen in the joint on ultrasound) without an injury are consistent with an acute gout flare. Take prednisone for 6 days until completed. Ok to take aleve or ibuprofen AFTER finishing the prednisone. Icing, compression, elevation as well. Follow up with me when you return from your trip.

## 2015-11-24 ENCOUNTER — Encounter: Payer: Self-pay | Admitting: Family Medicine

## 2015-11-24 DIAGNOSIS — M25572 Pain in left ankle and joints of left foot: Secondary | ICD-10-CM | POA: Insufficient documentation

## 2015-11-24 NOTE — Assessment & Plan Note (Signed)
with swelling and effusion.  Independently reviewed radiographs which are normal.  History, exam, ultrasound suggest an inflammatory arthropathy most commonly gout.  She is going to take prednisone dose pack for 6 days, can switch to nsaid after this.  Icing, compression, elevation.  Is going on a trip - will follow up with us after this.

## 2015-11-24 NOTE — Progress Notes (Signed)
PCP and consultation requested by: Willow OraJose Paz, MD  Subjective:   HPI: Patient is a 50 y.o. female here for left ankle pain.  Patient denies known injury. She states about 6 weeks ago she woke up with left ankle very swollen and painful. Pain worse with standing, anterior and medial, sharp. Pain is 1/10 at rest, up to 8/10 with standing when this was at its worst. Aleve helped. Swelling has gone down some. No skin changes, numbness. No history of gout, rheumatoid arthritis.  Past Medical History:  Diagnosis Date  . Abnormal uterine bleeding 07/2015  . Headache(784.0)     No current outpatient prescriptions on file prior to visit.   No current facility-administered medications on file prior to visit.     Past Surgical History:  Procedure Laterality Date  . APPENDECTOMY  at age 50  . BACK SURGERY    . CHOLECYSTECTOMY  ~2002  . KNEE SURGERY      No Known Allergies  Social History   Social History  . Marital status: Married    Spouse name: N/A  . Number of children: N/A  . Years of education: N/A   Occupational History  . Not on file.   Social History Main Topics  . Smoking status: Current Some Day Smoker  . Smokeless tobacco: Never Used  . Alcohol use Yes     Comment: occ  . Drug use: No  . Sexual activity: Yes    Birth control/ protection: Condom   Other Topics Concern  . Not on file   Social History Narrative  . No narrative on file    Family History  Problem Relation Age of Onset  . Arthritis    . Diabetes Mother   . Melanoma Mother     BP (!) 142/96   Pulse 97   Ht 5\' 9"  (1.753 m)   Wt 280 lb (127 kg)   LMP 10/25/2015 (Approximate)   BMI 41.35 kg/m   Review of Systems: See HPI above.    Objective:  Physical Exam:  Gen: NAD, comfortable in exam room  Left ankle: Diffuse swelling.  No bruising, other deformity. FROM with 5/5 strength all directions. TTP anteromedial ankle joint.  No malleolar, base 5th, navicular, extensor tendon  tenderness. Negative ant drawer and talar tilt.   Negative syndesmotic compression. Thompsons test negative. NV intact distally.  Right ankle: FROM without pain.    MSK u/s left ankle:  No evidence bony irregularities of malleoli.  Extensor tendons normal.  Talus also normal.  Effusion noted however.  Assessment & Plan:  1. Left ankle pain - with swelling and effusion.  Independently reviewed radiographs which are normal.  History, exam, ultrasound suggest an inflammatory arthropathy most commonly gout.  She is going to take prednisone dose pack for 6 days, can switch to nsaid after this.  Icing, compression, elevation.  Is going on a trip - will follow up with us after this.

## 2016-01-03 ENCOUNTER — Telehealth: Payer: Self-pay | Admitting: Family Medicine

## 2016-01-03 NOTE — Telephone Encounter (Signed)
Nobody from our office called.

## 2016-01-17 ENCOUNTER — Telehealth: Payer: Self-pay | Admitting: Family Medicine

## 2016-01-17 NOTE — Telephone Encounter (Signed)
When we saw her in September she was going out of town for a while and was supposed to follow up with us after she got back.  If she's back in town I want to see her again.  She only called us once two weeks ago (see note - she called saying she was returning a phone call from our office but we had not called her).

## 2016-01-18 ENCOUNTER — Encounter: Payer: Self-pay | Admitting: Family Medicine

## 2016-01-18 ENCOUNTER — Ambulatory Visit (INDEPENDENT_AMBULATORY_CARE_PROVIDER_SITE_OTHER): Payer: BLUE CROSS/BLUE SHIELD | Admitting: Family Medicine

## 2016-01-18 VITALS — BP 144/96 | HR 88 | Ht 70.0 in | Wt 279.0 lb

## 2016-01-18 DIAGNOSIS — M25572 Pain in left ankle and joints of left foot: Secondary | ICD-10-CM

## 2016-01-18 NOTE — Patient Instructions (Signed)
We will go ahead with an MRI of your ankle to assess for an osteochondral defect or other abnormality that would cause your persistent ankle pain and swelling.

## 2016-01-22 ENCOUNTER — Ambulatory Visit (HOSPITAL_BASED_OUTPATIENT_CLINIC_OR_DEPARTMENT_OTHER)
Admission: RE | Admit: 2016-01-22 | Discharge: 2016-01-22 | Disposition: A | Discharge status: Home / Self Care | Payer: BLUE CROSS/BLUE SHIELD | Source: Ambulatory Visit | Attending: Family Medicine | Admitting: Family Medicine

## 2016-01-22 DIAGNOSIS — M7662 Achilles tendinitis, left leg: Secondary | ICD-10-CM | POA: Diagnosis not present

## 2016-01-22 DIAGNOSIS — M76822 Posterior tibial tendinitis, left leg: Secondary | ICD-10-CM | POA: Diagnosis not present

## 2016-01-22 DIAGNOSIS — M25572 Pain in left ankle and joints of left foot: Secondary | ICD-10-CM | POA: Diagnosis not present

## 2016-01-22 DIAGNOSIS — M19072 Primary osteoarthritis, left ankle and foot: Secondary | ICD-10-CM | POA: Diagnosis not present

## 2016-01-22 DIAGNOSIS — M7989 Other specified soft tissue disorders: Secondary | ICD-10-CM | POA: Diagnosis not present

## 2016-01-24 NOTE — Assessment & Plan Note (Signed)
pain anteromedial.  Still struggling despite prednisone, nsaids, icing, elevation.  Discussed options - she would like to go ahead with MRI to assess for OCD, other abnormalities.

## 2016-01-24 NOTE — Progress Notes (Signed)
PCP and consultation requested by: Shelley OraJose Paz, MD  Subjective:   HPI: Patient is a 50 y.o. female here for left ankle pain.  9/18: Patient denies known injury. She states about 6 weeks ago she woke up with left ankle very swollen and painful. Pain worse with standing, anterior and medial, sharp. Pain is 1/10 at rest, up to 8/10 with standing when this was at its worst. Aleve helped. Swelling has gone down some. No skin changes, numbness. No history of gout, rheumatoid arthritis.  11/14: Patient reports she continues to struggle with left ankle pain. Pain is 4/10, constant dull pain, up to 8/10 and sharp at times. Tried aleve. Pain is anteromedial. Worse by end of day. Taking aleve as needed. Still with swelling. No skin changes, numbness.  Past Medical History:  Diagnosis Date  . Abnormal uterine bleeding 07/2015  . ZOXWRUEA(540.9Headache(784.0)     Current Outpatient Prescriptions on File Prior to Visit  Medication Sig Dispense Refill  . predniSONE (DELTASONE) 10 MG tablet 6 tabs po day 1, 5 tabs po day 2, 4 tabs po day 3, 3 tabs po day 4, 2 tabs po day 5, 1 tab po day 6 21 tablet 0   No current facility-administered medications on file prior to visit.     Past Surgical History:  Procedure Laterality Date  . APPENDECTOMY  at age 50  . BACK SURGERY    . CHOLECYSTECTOMY  ~2002  . KNEE SURGERY      No Known Allergies  Social History   Social History  . Marital status: Married    Spouse name: N/A  . Number of children: N/A  . Years of education: N/A   Occupational History  . Not on file.   Social History Main Topics  . Smoking status: Current Some Day Smoker  . Smokeless tobacco: Never Used  . Alcohol use Yes     Comment: occ  . Drug use: No  . Sexual activity: Yes    Birth control/ protection: Condom   Other Topics Concern  . Not on file   Social History Narrative  . No narrative on file    Family History  Problem Relation Age of Onset  . Arthritis    .  Diabetes Mother   . Melanoma Mother     BP (!) 144/96   Pulse 88   Ht 5\' 10"  (1.778 m)   Wt 279 lb (126.6 kg)   BMI 40.03 kg/m   Review of Systems: See HPI above.    Objective:  Physical Exam:  Gen: NAD, comfortable in exam room  Left ankle: Diffuse swelling.  No bruising, other deformity. FROM with 5/5 strength all directions. TTP anteromedial ankle joint.  No malleolar, base 5th, navicular, extensor tendon tenderness. Negative ant drawer and talar tilt.   Negative syndesmotic compression. Thompsons test negative. NV intact distally.  Right ankle: FROM without pain.  Assessment & Plan:  1. Left ankle pain - pain anteromedial.  Still struggling despite prednisone, nsaids, icing, elevation.  Discussed options - she would like to go ahead with MRI to assess for OCD, other abnormalities.

## 2016-01-25 ENCOUNTER — Ambulatory Visit (INDEPENDENT_AMBULATORY_CARE_PROVIDER_SITE_OTHER): Payer: BLUE CROSS/BLUE SHIELD | Admitting: Family Medicine

## 2016-01-25 DIAGNOSIS — M25572 Pain in left ankle and joints of left foot: Secondary | ICD-10-CM

## 2016-01-25 NOTE — Patient Instructions (Signed)
Do theraband strengthening exercises 3 sets of 10 once a day. Consider dr. Jari Sportsmanscholls active series, spencos, our green insoles with scaphoid pads. Cam walker with one of the above inserts or scaphoid pads is also an option. Nitro patches 1/4th patch to affected area, change daily tends to help with tendinopathies (risks include headache, skin irritation but most people don't get these side effects). Cortisone injection is another option though I haven't had great success for this tendinopathy. Follow up typically in 1 month to 6 weeks.

## 2016-01-26 NOTE — Progress Notes (Signed)
Patient came in today after we discussed MRI results on the phone - these were reassuring showing only post tib tendinitis.  Reviewed home exercise program, arch supports (different types).  She is going to hold off on nitro patches, injection.  See instructions for further.

## 2016-04-14 ENCOUNTER — Encounter: Payer: Self-pay | Admitting: Internal Medicine

## 2016-04-14 ENCOUNTER — Ambulatory Visit (INDEPENDENT_AMBULATORY_CARE_PROVIDER_SITE_OTHER): Payer: BLUE CROSS/BLUE SHIELD | Admitting: Internal Medicine

## 2016-04-14 VITALS — BP 126/68 | HR 83 | Temp 98.3°F | Resp 14 | Ht 70.0 in | Wt 285.0 lb

## 2016-04-14 DIAGNOSIS — J111 Influenza due to unidentified influenza virus with other respiratory manifestations: Secondary | ICD-10-CM

## 2016-04-14 MED ORDER — OSELTAMIVIR PHOSPHATE 75 MG PO CAPS: 75.0000 mg | ORAL_CAPSULE | Freq: Two times a day (BID) | ORAL | 0 refills | Status: DC

## 2016-04-14 MED ORDER — HYDROCODONE-HOMATROPINE 5-1.5 MG/5ML PO SYRP: 5.0000 mL | ORAL_SOLUTION | Freq: Every evening | ORAL | 0 refills | Status: DC | PRN

## 2016-04-14 NOTE — Progress Notes (Signed)
Pre visit review using our clinic review tool, if applicable. No additional management support is needed unless otherwise documented below in the visit note. 

## 2016-04-14 NOTE — Progress Notes (Signed)
Subjective:    Patient ID: Shelley Edwards, female    DOB: [birth year], 51 y.o.   MRN: [id number]  DOS:  04/14/2016 Type of visit - description : acute Interval history: 3 days ago developed malais but 2 days ago is when she started to feel really poorly---cough, chest congestion, chest pain with cough mostly anteriorly but some on the sides. Eyes are burning, she has chills. at work there are several sick persons.  Review of Systems Low-grade fever for 2 days, none today. Cough is dry, no sputum production. No nausea, vomiting. Did have some diarrhea No rash, mild headache. + Myalgias  Past Medical History:  Diagnosis Date  . Abnormal uterine bleeding 07/2015  . WUJWJXBJ(478.2)     Past Surgical History:  Procedure Laterality Date  . APPENDECTOMY  at age 23  . BACK SURGERY    . CHOLECYSTECTOMY  ~2002  . KNEE SURGERY      Social History   Social History  . Marital status: Married    Spouse name: N/A  . Number of children: N/A  . Years of education: N/A   Occupational History  . Not on file.   Social History Main Topics  . Smoking status: Current Some Day Smoker  . Smokeless tobacco: Never Used  . Alcohol use Yes     Comment: occ  . Drug use: No  . Sexual activity: Yes    Birth control/ protection: Condom   Other Topics Concern  . Not on file   Social History Narrative  . No narrative on file      Allergies as of 04/14/2016   No Known Allergies     Medication List       Accurate as of 04/14/16 11:59 PM. Always use your most recent med list.          HYDROcodone-homatropine 5-1.5 MG/5ML syrup Commonly known as:  HYCODAN Take 5 mLs by mouth at bedtime as needed for cough.   oseltamivir 75 MG capsule Commonly known as:  TAMIFLU Take 1 capsule (75 mg total) by mouth 2 (two) times daily.          Objective:   Physical Exam BP 126/68 (BP Location: Left Arm, Patient Position: Sitting, Cuff Size: Normal)   Pulse 83   Temp 98.3 F (36.8 C)  (Oral)   Resp 14   Ht 5\' 10"  (1.778 m)   Wt 285 lb (129.3 kg)   SpO2 97%   BMI 40.89 kg/m  General:   Well developed,  persisting cough noted but no distress, does not look toxic.  HEENT:  Normocephalic . Face symmetric, atraumatic. TMs normal, nose congested, sinuses are slightly tender at bilaterally @ maxillary areas. Throat symmetric, no red Lungs:  Few rhonchi with cough Normal respiratory effort, no intercostal retractions, no accessory muscle use. Heart: RRR,  no murmur.  No pretibial edema bilaterally  Skin: Not pale. Not jaundice Neurologic:  alert & oriented X3.  Speech normal, gait appropriate for age and unassisted Psych--  Cognition and judgment appear intact.  Cooperative with normal attention span and concentration.  Behavior appropriate. No anxious or depressed appearing.      Assessment & Plan:    Assessment Healthy Pre monopausal , LMP ~ 07-2015  Plan: Influenza: Patient presents with a viral syndrome, given current epidemic she probably has influenza, severe sx  started within 48 hours, in addition to conservative treatment will recommend Tamiflu. Cough control with Mucinex DM and hydrocodone. Drowsiness discussed. Patient is aware  I suspect  Influenza, not a cold, if she is not improving or if she improves and then gets worse needs to let me know. Also interested in obesity treatment, asked her to come back another time or take a referral to one of the bariatric clinic

## 2016-04-14 NOTE — Patient Instructions (Signed)
Rest, fluids , tylenol  For cough:  Take Mucinex DM twice a day as needed until better Use hydrocodone if the cough is persistent at night. Will cause drowsiness  For nasal congestion: Use OTC Nasocort or Flonase : 2 nasal sprays on each side of the nose in the morning until you feel better Get pseudoephedrine 30 mg (behind the counter, you need to talk with the pharmacist) take one tablet 3 or 4 times a day as needed for congestion  Take TAMIFLU   Call if not gradually better over the next  10 days or if you get better and then the symptoms come back.  Call anytime if the symptoms are severe

## 2016-04-15 NOTE — Assessment & Plan Note (Signed)
Influenza: Patient presents with a viral syndrome, given current epidemic she probably has influenza, severe sx  started within 48 hours, in addition to conservative treatment will recommend Tamiflu. Cough control with Mucinex DM and hydrocodone. Drowsiness discussed. Patient is aware I suspect  Influenza, not a cold, if she is not improving or if she improves and then gets worse needs to let me know. Also interested in obesity treatment, asked her to come back another time or take a referral to one of the bariatric clinic

## 2016-05-15 ENCOUNTER — Telehealth: Payer: Self-pay | Admitting: Internal Medicine

## 2016-05-15 NOTE — Telephone Encounter (Signed)
PLEASE NOTE: All timestamps contained within this report are represented as [place] Standard Time. CONFIDENTIALTY NOTICE: This fax transmission is intended only for the addressee. It contains information that is legally privileged, confidential or otherwise protected from use or disclosure. If you are not the intended recipient, you are strictly prohibited from reviewing, disclosing, copying using or disseminating any of this information or taking any action in reliance on or regarding this information. If you have received this fax in error, please notify us immediately by telephone so that we can arrange for its return to us. Phone: [phone], Toll-Free: [phone], Fax: [phone] Page: 1 of 1 Call Id: [id number] [facility] - Client TELEPHONE ADVICE RECORD [facility] Patient Name: Shelley ArisLAURA Police DOB: May 30, 1965 Initial Comment Caller states that she has had left knee pain for 3 weeks and 2 weeks ago she noticed that there is some type of lump that is on the back of her leg near the knee. A couple night ago she also noitced that her foot was getting cold. Caller states that she travles alot also. Nurse Assessment Nurse: Elijah Birkaldwell, RN, Lynda Date/Time (Eastern Time): 05/15/2016 12:03:04 PM Confirm and document reason for call. If symptomatic, describe symptoms. ---Caller states that she has had left knee pain for 3 weeks and 2 weeks ago she noticed that there is some type of lump that is on the back of her leg near the knee. A couple night ago she also noticed that her foot was getting cold. Caller states that she travels a lot also. Has been taking Aleve. In [place], got rx for Ibuprofen when seen. Not sure if fever. Does the patient have any new or worsening symptoms? ---Yes Will a triage be completed? ---Yes Related visit to physician within the last 2 weeks? ---No Does the PT have any chronic conditions? (i.e. diabetes, asthma,  etc.) ---No Is the patient pregnant or possibly pregnant? (Ask all females between the ages of 1012-55) ---No Is this a behavioral health or substance abuse call? ---No Guidelines Guideline Title Affirmed Question Affirmed Notes Knee Pain [1] MODERATE pain (e.g., interferes with normal activities, limping) AND [2] present > 3 days Final Disposition User See PCP When Office is Open (within 3 days) Elijah Birkaldwell, Charity fundraiserN, Stark BrayLynda Disagree/Comply: Danella Maiersomply

## 2016-05-16 ENCOUNTER — Ambulatory Visit (HOSPITAL_BASED_OUTPATIENT_CLINIC_OR_DEPARTMENT_OTHER)
Admission: RE | Admit: 2016-05-16 | Discharge: 2016-05-16 | Disposition: A | Discharge status: Home / Self Care | Payer: BLUE CROSS/BLUE SHIELD | Source: Ambulatory Visit | Attending: Internal Medicine | Admitting: Internal Medicine

## 2016-05-16 ENCOUNTER — Encounter: Payer: Self-pay | Admitting: Internal Medicine

## 2016-05-16 ENCOUNTER — Ambulatory Visit (INDEPENDENT_AMBULATORY_CARE_PROVIDER_SITE_OTHER): Payer: BLUE CROSS/BLUE SHIELD | Admitting: Internal Medicine

## 2016-05-16 VITALS — BP 126/74 | HR 82 | Temp 98.1°F | Resp 14 | Ht 70.0 in | Wt 285.0 lb

## 2016-05-16 DIAGNOSIS — M25562 Pain in left knee: Secondary | ICD-10-CM | POA: Diagnosis not present

## 2016-05-16 DIAGNOSIS — M7122 Synovial cyst of popliteal space [Baker], left knee: Secondary | ICD-10-CM | POA: Insufficient documentation

## 2016-05-16 DIAGNOSIS — M7989 Other specified soft tissue disorders: Secondary | ICD-10-CM | POA: Diagnosis not present

## 2016-05-16 MED ORDER — DICLOFENAC SODIUM 75 MG PO TBEC: 75.0000 mg | DELAYED_RELEASE_TABLET | Freq: Two times a day (BID) | ORAL | 0 refills | Status: DC | PRN

## 2016-05-16 NOTE — Progress Notes (Signed)
Pre visit review using our clinic review tool, if applicable. No additional management support is needed unless otherwise documented below in the visit note. 

## 2016-05-16 NOTE — Assessment & Plan Note (Signed)
Knee pain: meniscal tear? Refer to sports medicine, try diclofenac, she failed ibuprofen. Calf swelling: minimal swelling without tenderness, recent trip to [place], get a ultrasound rule out DVT Lump, left calf: See graphic,  Baker's cysts? Doubt phlebitis (no red-warm).  U/S should help determine the etiology.

## 2016-05-16 NOTE — Patient Instructions (Signed)
Get the ultrasound today  ICE twice a day    Take diclofenac as needed for pain.  Always take it with food because may cause gastritis and ulcers.  If you notice nausea, stomach pain, change in the color of stools --->  Stop the medicine and let us know

## 2016-05-16 NOTE — Progress Notes (Signed)
Subjective:    Patient ID: Shelley Edwards, female    DOB: 03-18-1965, 51 y.o.   MRN: [id number]  DOS:  05/16/2016 Type of visit - description : Acute Interval history: Left knee pain for several weeks, mostly at the inner aspect, mild swelling? No redness. Went to [place] 3 weeks ago, came back 10 days ago. While in [place] pain was very severe,saw a MD, took ibuprofen without much relief. One day, she walked for several hours and noted a bump on the left calf but with no pain. When she takes airplane trips, she usually has B LE edema, this time the left leg is is still slightly swollen but again no calf pain. She's going back to [place] in 5 days.  Review of Systems No fever chills, no lower extremity injuries. No chest pain, difficulty breathing or palpitations  Past Medical History:  Diagnosis Date  . Abnormal uterine bleeding 07/2015  . JXBJYNWG(956.2)     Past Surgical History:  Procedure Laterality Date  . APPENDECTOMY  at age 54  . BACK SURGERY    . CHOLECYSTECTOMY  ~2002  . KNEE SURGERY      Social History   Social History  . Marital status: Married    Spouse name: N/A  . Number of children: N/A  . Years of education: N/A   Occupational History  . Not on file.   Social History Main Topics  . Smoking status: Current Some Day Smoker  . Smokeless tobacco: Never Used  . Alcohol use Yes     Comment: occ  . Drug use: No  . Sexual activity: Yes    Birth control/ protection: Condom   Other Topics Concern  . Not on file   Social History Narrative  . No narrative on file      Allergies as of 05/16/2016   No Known Allergies     Medication List       Accurate as of 05/16/16  3:20 PM. Always use your most recent med list.          diclofenac 75 MG EC tablet Commonly known as:  VOLTAREN Take 1 tablet (75 mg total) by mouth 2 (two) times daily as needed.          Objective:   Physical Exam  Musculoskeletal:       Legs:  BP 126/74 (BP Location:  Left Arm, Patient Position: Sitting, Cuff Size: Normal)   Pulse 82   Temp 98.1 F (36.7 C) (Oral)   Resp 14   Ht 5\' 10"  (1.778 m)   Wt 285 lb (129.3 kg)   SpO2 98%   BMI 40.89 kg/m  General:   Well developed, well nourished . NAD.  HEENT:  Normocephalic . Face symmetric, atraumatic Lungs:  CTA B Normal respiratory effort, no intercostal retractions, no accessory muscle use. Heart: RRR,  no murmur.  No pretibial edema bilaterally  Skin: Lower extremities without rash MSK: Right knee and leg normal Left knee: TTP at the medial aspect, slightly warm there?. Range of motion normal without pain. Left calf no TTP but circumference is larger by around 3/4 of inch  Neurologic:  alert & oriented X3.  Speech normal, gait appropriate for age and unassisted Psych--  Cognition and judgment appear intact.  Cooperative with normal attention span and concentration.  Behavior appropriate. No anxious or depressed appearing.      Assessment & Plan:   Assessment Healthy Pre monopausal , LMP ~ 07-2015  Plan: Knee pain:  meniscal tear? Refer to sports medicine, try diclofenac, she failed ibuprofen. Calf swelling: minimal swelling without tenderness, recent trip to [place], get a ultrasound rule out DVT Lump, left calf: See graphic,  Baker's cysts? Doubt phlebitis (no red-warm).  U/S should help determine the etiology.

## 2016-05-16 NOTE — Telephone Encounter (Signed)
Patient coming in to see Dr. Drue NovelPaz today.

## 2016-05-18 ENCOUNTER — Ambulatory Visit (INDEPENDENT_AMBULATORY_CARE_PROVIDER_SITE_OTHER): Payer: BLUE CROSS/BLUE SHIELD | Admitting: Family Medicine

## 2016-05-18 ENCOUNTER — Encounter: Payer: Self-pay | Admitting: Family Medicine

## 2016-05-18 VITALS — BP 131/88 | HR 78 | Ht 70.0 in | Wt 281.0 lb

## 2016-05-18 DIAGNOSIS — M25562 Pain in left knee: Secondary | ICD-10-CM | POA: Diagnosis not present

## 2016-05-18 MED ORDER — METHYLPREDNISOLONE ACETATE 40 MG/ML IJ SUSP
40.0000 mg | Freq: Once | INTRAMUSCULAR | Status: AC
  Administered 2016-05-18: 40 mg via INTRA_ARTICULAR

## 2016-05-18 NOTE — Patient Instructions (Signed)
Your pain is due to arthritis.  You also have a large baker's cyst but this is a result of the arthritis, not the cause of the pain. These are the different medications you can take for this: Tylenol 500mg  1-2 tabs three times a day for pain. Diclofenac twice a day with food Capsaicin, aspercreme, or biofreeze topically up to four times a day may also help with pain. Some supplements that may help for arthritis: Boswellia extract, curcumin, pycnogenol Cortisone injections are an option. If cortisone injections do not help, there are different types of shots that may help but they take longer to take effect - you were given a cortisone shot today. It's important that you continue to stay active. Straight leg raises, knee extensions 3 sets of 10 once a day (add ankle weight if these become too easy). Consider physical therapy to strengthen muscles around the joint that hurts to take pressure off of the joint itself. Shoe inserts with good arch support may be helpful. Walker or cane if needed. Heat or ice 15 minutes at a time 3-4 times a day as needed to help with pain. Water aerobics and cycling with low resistance are the best two types of exercise for arthritis. Call me when you return if you're not improving.

## 2016-05-23 NOTE — Progress Notes (Signed)
PCP: Willow OraJose Paz, MD  Subjective:   HPI: Patient is a 51 y.o. female here for left knee pain.  Patient reports she is having medial pain mostly in left knee. Pain level 5/10 and sharp. Associated swelling and has known bakers cyst behind this knee. Has been taking ibuprofen. Feels better at nighttime. Pain present for about 4 weeks. No skin changes, numbness  Past Medical History:  Diagnosis Date  . Abnormal uterine bleeding 07/2015  . WJXBJYNW(295.6Headache(784.0)     Current Outpatient Prescriptions on File Prior to Visit  Medication Sig Dispense Refill  . diclofenac (VOLTAREN) 75 MG EC tablet Take 1 tablet (75 mg total) by mouth 2 (two) times daily as needed. 40 tablet 0   No current facility-administered medications on file prior to visit.     Past Surgical History:  Procedure Laterality Date  . APPENDECTOMY  at age 51  . BACK SURGERY    . CHOLECYSTECTOMY  ~2002  . KNEE SURGERY      No Known Allergies  Social History   Social History  . Marital status: Married    Spouse name: N/A  . Number of children: N/A  . Years of education: N/A   Occupational History  . Not on file.   Social History Main Topics  . Smoking status: Current Some Day Smoker  . Smokeless tobacco: Never Used  . Alcohol use Yes     Comment: occ  . Drug use: No  . Sexual activity: Yes    Birth control/ protection: Condom   Other Topics Concern  . Not on file   Social History Narrative  . No narrative on file    Family History  Problem Relation Age of Onset  . Arthritis    . Diabetes Mother   . Melanoma Mother     BP 131/88   Pulse 78   Ht 5\' 10"  (1.778 m)   Wt 281 lb (127.5 kg)   BMI 40.32 kg/m   Review of Systems: See HPI above.     Objective:  Physical Exam:  Gen: NAD, comfortable in exam room  Left knee: Mild effusion.  Palpable baker's cyst.  No other deformity, bruising. TTP medial joint line, less post patellar facets.  No other tenderness. FROM. Negative ant/post  drawers. Negative valgus/varus testing. Negative lachmanns. Negative mcmurrays, apleys, patellar apprehension. NV intact distally.  MSK u/s: confirms mild effusion but also with large complex bakers cyst - no neovascularity surrounding this.   Assessment & Plan:  1. Left knee pain - 2/2 DJD with large baker's cyst.  Discussed options - went ahead with intraarticular injection today.  Tylenol with diclofenac.  Topical medications, supplements that may help discussed.  Shown home exercises to do daily.  Consider aspiration/injection of knee joint or cyst if not improving as expected.  Consider physical therapy.

## 2016-05-30 ENCOUNTER — Telehealth: Payer: Self-pay | Admitting: Family Medicine

## 2016-05-30 NOTE — Telephone Encounter (Signed)
We discussed two options if the shot by itself didn't work - aspiration/injection of the large cyst she has or physical therapy.

## 2016-05-30 NOTE — Telephone Encounter (Signed)
Patient calling stating she is still experiencing pain in her left knee. Requesting a call back to discuss options

## 2016-05-31 ENCOUNTER — Ambulatory Visit (INDEPENDENT_AMBULATORY_CARE_PROVIDER_SITE_OTHER): Payer: BLUE CROSS/BLUE SHIELD | Admitting: Family Medicine

## 2016-05-31 ENCOUNTER — Encounter: Payer: Self-pay | Admitting: Family Medicine

## 2016-05-31 VITALS — BP 127/82 | HR 73 | Ht 70.0 in | Wt 280.0 lb

## 2016-05-31 DIAGNOSIS — M25562 Pain in left knee: Secondary | ICD-10-CM

## 2016-05-31 DIAGNOSIS — G8929 Other chronic pain: Secondary | ICD-10-CM

## 2016-05-31 MED ORDER — METHYLPREDNISOLONE ACETATE 40 MG/ML IJ SUSP
40.0000 mg | Freq: Once | INTRAMUSCULAR | Status: AC
  Administered 2016-05-31: 40 mg via INTRA_ARTICULAR

## 2016-06-01 NOTE — Telephone Encounter (Signed)
Appointment made for aspiration/injection.

## 2016-06-05 DIAGNOSIS — M25562 Pain in left knee: Secondary | ICD-10-CM | POA: Insufficient documentation

## 2016-06-05 NOTE — Progress Notes (Signed)
PCP: Willow Ora, MD  Subjective:   HPI: Patient is a 51 y.o. female here for left knee pain.  3/15: Patient reports she is having medial pain mostly in left knee. Pain level 5/10 and sharp. Associated swelling and has known bakers cyst behind this knee. Has been taking ibuprofen. Feels better at nighttime. Pain present for about 4 weeks. No skin changes, numbness  3/28: Patient returns for aspiration/injection of baker's cyst. Pain is 9/10, sharp, with swelling. Felt good right after injection last visit but this did not last unfortunately. No skin changes, numbness.  Past Medical History:  Diagnosis Date  . Abnormal uterine bleeding 07/2015  . UJWJXBJY(782.9)     Current Outpatient Prescriptions on File Prior to Visit  Medication Sig Dispense Refill  . diclofenac (VOLTAREN) 75 MG EC tablet Take 1 tablet (75 mg total) by mouth 2 (two) times daily as needed. 40 tablet 0   No current facility-administered medications on file prior to visit.     Past Surgical History:  Procedure Laterality Date  . APPENDECTOMY  at age 81  . BACK SURGERY    . CHOLECYSTECTOMY  ~2002  . KNEE SURGERY      No Known Allergies  Social History   Social History  . Marital status: Married    Spouse name: N/A  . Number of children: N/A  . Years of education: N/A   Occupational History  . Not on file.   Social History Main Topics  . Smoking status: Current Some Day Smoker  . Smokeless tobacco: Never Used  . Alcohol use Yes     Comment: occ  . Drug use: No  . Sexual activity: Yes    Birth control/ protection: Condom   Other Topics Concern  . Not on file   Social History Narrative  . No narrative on file    Family History  Problem Relation Age of Onset  . Arthritis    . Diabetes Mother   . Melanoma Mother     BP 127/82   Pulse 73   Ht  (1.778 m)   Wt 280 lb (127 kg)   BMI 40.18 kg/m   Review of Systems: See HPI above.     Objective:  Physical Exam:  Gen:  NAD, comfortable in exam room  Left knee: Mild effusion.  Palpable baker's cyst.  No other deformity, bruising. TTP medial joint line, less post patellar facets.  No other tenderness. FROM.  MSK u/s: confirms mild effusion but also with large complex bakers cyst.   Assessment & Plan:  1. Left knee pain - 2/2 DJD with large baker's cyst.  Cortisone injection without lasting relief.  Aspiration/injection of baker's cyst today.  Tylenol with diclofenac.  Topical medications, supplements that may help discussed.  Home exercises.  Would go ahead with imaging if still not improving.  After informed written consent patient was lying prostrate on exam table.  Ultrasound used to identify bakers cyst away from neurovascular structures.  Left knee was then prepped with alcohol swab.  3 mL of bupivicaine was used for local anesthesia.  Then using an 18g needle on 60cc syringe, 42 mL of clear straw-colored fluid was aspirated from left knee baker's cyst.  Knee was then injected with 3:1 bupivicaine:depomedrol.  Patient tolerated procedure well without immediate complications.

## 2016-06-05 NOTE — Assessment & Plan Note (Signed)
2/2 DJD with large baker's cyst.  Cortisone injection without lasting relief.  Aspiration/injection of baker's cyst today.  Tylenol with diclofenac.  Topical medications, supplements that may help discussed.  Home exercises.  Would go ahead with imaging if still not improving.  After informed written consent patient was lying prostrate on exam table.  Ultrasound used to identify bakers cyst away from neurovascular structures.  Left knee was then prepped with alcohol swab.  3 mL of bupivicaine was used for local anesthesia.  Then using an 18g needle on 60cc syringe, 42 mL of clear straw-colored fluid was aspirated from left knee baker's cyst.  Knee was then injected with 3:1 bupivicaine:depomedrol.  Patient tolerated procedure well without immediate complications.

## 2016-06-12 NOTE — Addendum Note (Signed)
Addended by: Kathi Simpers F on: 06/12/2016 01:06 PM   Modules accepted: Orders

## 2016-06-13 ENCOUNTER — Ambulatory Visit (HOSPITAL_BASED_OUTPATIENT_CLINIC_OR_DEPARTMENT_OTHER)
Admission: RE | Admit: 2016-06-13 | Discharge: 2016-06-13 | Disposition: A | Discharge status: Home / Self Care | Payer: BLUE CROSS/BLUE SHIELD | Source: Ambulatory Visit | Attending: Family Medicine | Admitting: Family Medicine

## 2016-06-13 DIAGNOSIS — M1712 Unilateral primary osteoarthritis, left knee: Secondary | ICD-10-CM | POA: Insufficient documentation

## 2016-06-13 DIAGNOSIS — M25462 Effusion, left knee: Secondary | ICD-10-CM | POA: Insufficient documentation

## 2016-06-13 DIAGNOSIS — M25562 Pain in left knee: Secondary | ICD-10-CM | POA: Diagnosis present

## 2016-06-15 NOTE — Addendum Note (Signed)
Addended by: Kathi Simpers F on: 06/15/2016 10:48 AM   Modules accepted: Orders

## 2016-06-26 ENCOUNTER — Ambulatory Visit (INDEPENDENT_AMBULATORY_CARE_PROVIDER_SITE_OTHER): Payer: BLUE CROSS/BLUE SHIELD

## 2016-06-26 ENCOUNTER — Telehealth: Payer: Self-pay | Admitting: *Deleted

## 2016-06-26 DIAGNOSIS — M25562 Pain in left knee: Secondary | ICD-10-CM

## 2016-06-26 DIAGNOSIS — M7122 Synovial cyst of popliteal space [Baker], left knee: Secondary | ICD-10-CM | POA: Diagnosis not present

## 2016-06-26 DIAGNOSIS — M23222 Derangement of posterior horn of medial meniscus due to old tear or injury, left knee: Secondary | ICD-10-CM

## 2016-06-26 DIAGNOSIS — S83242A Other tear of medial meniscus, current injury, left knee, initial encounter: Secondary | ICD-10-CM | POA: Diagnosis not present

## 2016-06-26 NOTE — Telephone Encounter (Signed)
Spoke with patient about results.  Will refer to ortho for discussion of arthroscopy - Has seen Dr. Ranell Patrick before - will try to set up appointment ASAP given level of her pain.

## 2016-06-26 NOTE — Telephone Encounter (Signed)
Patient called regarding MRI results.

## 2016-06-27 NOTE — Telephone Encounter (Signed)
Spoke to patient and trying to get appointment set up with Dr. Ranell Patrick.

## 2016-06-27 NOTE — Addendum Note (Signed)
Addended by: Kathi Simpers F on: 06/27/2016 02:25 PM   Modules accepted: Orders

## 2016-07-03 ENCOUNTER — Ambulatory Visit (INDEPENDENT_AMBULATORY_CARE_PROVIDER_SITE_OTHER): Payer: BLUE CROSS/BLUE SHIELD | Admitting: Orthopaedic Surgery

## 2016-07-03 ENCOUNTER — Ambulatory Visit (INDEPENDENT_AMBULATORY_CARE_PROVIDER_SITE_OTHER): Payer: Self-pay | Admitting: Orthopaedic Surgery

## 2016-07-03 DIAGNOSIS — G8929 Other chronic pain: Secondary | ICD-10-CM

## 2016-07-03 DIAGNOSIS — S83242A Other tear of medial meniscus, current injury, left knee, initial encounter: Secondary | ICD-10-CM | POA: Diagnosis not present

## 2016-07-03 DIAGNOSIS — M25562 Pain in left knee: Secondary | ICD-10-CM | POA: Diagnosis not present

## 2016-07-03 NOTE — Progress Notes (Signed)
Office Visit Note   Patient: Shelley Edwards           Date of Birth: [birth year]           MRN: [id number] Visit Date: 07/03/2016              Requested by: Wanda Plump, MD [address] [place], [place] [address] PCP: Willow Ora, MD   Assessment & Plan: Visit Diagnoses:  1. Chronic pain of left knee   2. Acute medial meniscal tear, left, initial encounter     Plan: SEE OTHER NOTE FROM TODAY  Follow-Up Instructions: Return for 1 week post-op.   Orders:  No orders of the defined types were placed in this encounter.  No orders of the defined types were placed in this encounter.     Procedures: No procedures performed   Clinical Data: No additional findings.   Subjective: No chief complaint on file.   HPI  Review of Systems   Objective: Vital Signs: There were no vitals taken for this visit.  Physical Exam  Ortho Exam  Specialty Comments:  No specialty comments available.  Imaging: No results found.   PMFS History: Patient Active Problem List   Diagnosis Date Noted  . Acute medial meniscal tear, left, initial encounter 07/03/2016  . Left knee pain 06/05/2016  . Left ankle pain 11/24/2015  . PCP NOTES >>>>>>>>>>>>>> 07/20/2015  . Chest wall pain 03/13/2012  . DYSPEPSIA 07/23/2006  . HEADACHE 07/23/2006   Past Medical History:  Diagnosis Date  . Abnormal uterine bleeding 07/2015  . Headache(784.0)     Family History  Problem Relation Age of Onset  . Arthritis    . Diabetes Mother   . Melanoma Mother     Past Surgical History:  Procedure Laterality Date  . APPENDECTOMY  at age 88  . BACK SURGERY    . CHOLECYSTECTOMY  ~2002  . KNEE SURGERY     Social History   Occupational History  . Not on file.   Social History Main Topics  . Smoking status: Current Some Day Smoker  . Smokeless tobacco: Never Used  . Alcohol use Yes     Comment: occ  . Drug use: No  . Sexual activity: Yes    Birth control/ protection: Condom

## 2016-07-03 NOTE — Progress Notes (Signed)
Office Visit Note   Patient: Shelley Edwards           Date of Birth: [birth year]           MRN: [id number] Visit Date: 07/03/2016              Requested by: Wanda Plump, MD [address] [place], [place] [address] PCP: Willow Ora, MD   Assessment & Plan: Visit Diagnoses:  1. Chronic pain of left knee   2. Acute medial meniscal tear, left, initial encounter     Plan: Given her clinical exam combined with her symptoms of knee pain with locking and catching as well as her MRI showing a meniscal tear with significant cartilage changes in the medial compartment of her knee we are recommending an arthroscopic intervention. She is hoping for this as well. This pain is detrimentally affects her activity is daily living, her quality of life, and her mobility. His affected her work as well. She has had a successful right knee arthroscopy in the past. With thorough discussion of risk and benefits of the surgery and the goals. All questions were encouraged and answered. We will extend her out of work at least for next week and to likely 2 weeks postoperative. We will see her back in 1 week after surgery for suture removal. I did show her quad strengthening exercises and we did put about weight loss as well.  Follow-Up Instructions: Return for 1 week post-op.   Orders:  No orders of the defined types were placed in this encounter.  No orders of the defined types were placed in this encounter.     Procedures: No procedures performed   Clinical Data: No additional findings.   Subjective: No chief complaint on file. The patient comes in his referral from Dr. Norton Blizzard with left knee pain is worsening for last 9 weeks. Plain films and MRI are on the canopy system for me to review. She has no known injury and just slowly has gotten worse. Now she is getting swelling and a constant pain in her knee. His worsens with activities specialty and pivoting activities. There is been some  locking and catching as well. She is tried to steroid injections as well as been on diclofenac and none of this is helped. It is detrimentally affected her activities daily living, her quality of life, and her mobility. She does wish for some other intervention that would help her get along better.  HPI  Review of Systems   Objective: Vital Signs: There were no vitals taken for this visit.  Physical Exam She is alert and oriented 3 in no acute distress. She does walk with a significant limp. Ortho Exam She is a moderately obese individual his pain over the medial joint line of her left knee. He can palpate a Baker cyst in the back of her knee. She has a positive Murray sign to the medial compartment. She has a mild effusion. There is pain significant only over the tibial plateau. She's got good range of motion of her knee. There is no instability with varus valgus stressing. Her Lockman's test is negative. Specialty Comments:  No specialty comments available.  Imaging: No results found. X-rays and MRI on the canopy system and the patellae reviewed by me show medial joint space narrowing. There is subchondral edema in the tibial plateau suggesting arthritic reactive changes. There is high-grade partial thickness cartilage loss of medial femoral condyle and medial tibial  plateau. There is also high grade cartilage loss in the patellofemoral joint. There is a radial meniscal tear at the meniscal root medially with extrusion of the meniscus.  PMFS History: Patient Active Problem List   Diagnosis Date Noted  . Acute medial meniscal tear, left, initial encounter 07/03/2016  . Left knee pain 06/05/2016  . Left ankle pain 11/24/2015  . PCP NOTES >>>>>>>>>>>>>> 07/20/2015  . Chest wall pain 03/13/2012  . DYSPEPSIA 07/23/2006  . HEADACHE 07/23/2006   Past Medical History:  Diagnosis Date  . Abnormal uterine bleeding 07/2015  . Headache(784.0)     Family History  Problem Relation Age of  Onset  . Arthritis    . Diabetes Mother   . Melanoma Mother     Past Surgical History:  Procedure Laterality Date  . APPENDECTOMY  at age 58  . BACK SURGERY    . CHOLECYSTECTOMY  ~2002  . KNEE SURGERY     Social History   Occupational History  . Not on file.   Social History Main Topics  . Smoking status: Current Some Day Smoker  . Smokeless tobacco: Never Used  . Alcohol use Yes     Comment: occ  . Drug use: No  . Sexual activity: Yes    Birth control/ protection: Condom

## 2016-07-18 ENCOUNTER — Encounter: Payer: Self-pay | Admitting: Cardiology

## 2016-07-18 DIAGNOSIS — I484 Atypical atrial flutter: Secondary | ICD-10-CM | POA: Diagnosis not present

## 2016-07-18 DIAGNOSIS — Z01810 Encounter for preprocedural cardiovascular examination: Secondary | ICD-10-CM | POA: Diagnosis not present

## 2016-07-18 DIAGNOSIS — Z01812 Encounter for preprocedural laboratory examination: Secondary | ICD-10-CM | POA: Diagnosis not present

## 2016-07-19 ENCOUNTER — Telehealth (INDEPENDENT_AMBULATORY_CARE_PROVIDER_SITE_OTHER): Payer: Self-pay | Admitting: Orthopaedic Surgery

## 2016-07-19 DIAGNOSIS — I483 Typical atrial flutter: Secondary | ICD-10-CM | POA: Diagnosis not present

## 2016-07-19 NOTE — Telephone Encounter (Signed)
Pt requested a call back regarding her next steps due to her not having surgery.  [phone]

## 2016-07-19 NOTE — Telephone Encounter (Signed)
Her Cardiologist has cleared her for surgery, but wants me to do it at one of the hospitals so she can be monitored overnight.  Shelley Edwards or Shelley Edwards needs to see about getting her set up for the in the next 2 weeks

## 2016-07-19 NOTE — Telephone Encounter (Signed)
Please advise 

## 2016-07-20 ENCOUNTER — Telehealth (INDEPENDENT_AMBULATORY_CARE_PROVIDER_SITE_OTHER): Payer: Self-pay | Admitting: Orthopaedic Surgery

## 2016-07-20 NOTE — Telephone Encounter (Signed)
Patient called asked for a call back concerning her surgery and time off from work. Patient want to know how she will file her claim for work as far as the days she will be out of work. The number to contact patient is [phone]

## 2016-07-20 NOTE — Telephone Encounter (Signed)
Patient asking for a call back as soon as we can, states she needs to know when surgery will be. She has been cleared through cardiologist per patient

## 2016-07-20 NOTE — Telephone Encounter (Signed)
See below

## 2016-07-21 NOTE — Telephone Encounter (Signed)
Spoke with pt and advised per Dr. Donnie Ahoilley surgery should be done at hospital.  I told her I would review surgery schedule and call her back.

## 2016-07-24 ENCOUNTER — Other Ambulatory Visit: Payer: Self-pay

## 2016-07-24 ENCOUNTER — Inpatient Hospital Stay (INDEPENDENT_AMBULATORY_CARE_PROVIDER_SITE_OTHER): Payer: Self-pay | Admitting: Orthopaedic Surgery

## 2016-07-24 DIAGNOSIS — I483 Typical atrial flutter: Secondary | ICD-10-CM | POA: Diagnosis not present

## 2016-07-25 ENCOUNTER — Encounter (HOSPITAL_COMMUNITY): Payer: Self-pay | Admitting: *Deleted

## 2016-07-26 ENCOUNTER — Other Ambulatory Visit (INDEPENDENT_AMBULATORY_CARE_PROVIDER_SITE_OTHER): Payer: Self-pay | Admitting: Physician Assistant

## 2016-07-26 ENCOUNTER — Encounter (HOSPITAL_COMMUNITY): Payer: Self-pay | Admitting: *Deleted

## 2016-07-26 NOTE — Progress Notes (Signed)
Pt did not want to complete the pre-op assessment. Pt stated " you should already have my records!" An attempt was made to explain to patient the importance of completing the  assessment. Pt wanted pre-op instructions only. An apology was given and pt was provided with pre-op instructions according to pre-op check list. Pt wears a Holter monitor and was advised to keep it on DOS when asked if she should remove it. Pt was advised to take Metoprolol the morning of surgery and pt stated " I take it at 10:00, I will take it when I get there."  Pt made aware to stop taking Aspirin, vitamins, fish oil and herbal medications. Do not take any NSAIDs ie: Ibuprofen, Advil, Naproxen, BC and Goody Powder or any medication containing Aspirin. Pt verbalized understanding of all pre-op instructions. Anesthesia asked to review pt history.

## 2016-07-26 NOTE — Progress Notes (Signed)
Anesthesia Chart Review:  Pt is a 51 year old female scheduled for L knee arthroscopy with partial medial meniscectomy on 07/27/2016 with Doneen Poissonhristopher Blackman, MD  Surgery was originally scheduled for outside surgery center, but pt found to be in atrial flutter on arrival and case was cancelled.  - Pt then referred for cardiac evaluation. Saw Viann FishSpencer Tilley, MD on 07/18/16.  Started on metoprolol, cleared for surgery.   PMH includes:  Atrial flutter. Current smoker.   Medications include: metoprolol  Labs will be obtained DOS  EKG 07/18/13 (Dr. York Spanielilley's office): Sinus rhythm  Echo 07/24/16 (Dr. York Spanielilley's office): 1. Mild to moderate concentric LVH with normal global wall motion. EF 55%. 2. Moderate LA enlargement. 3. Trace mitral and tricuspid regurgitation. 4. IVC is dilated with blunted respiratory response.  If labs acceptable DOS, I anticipate pt can proceed with surgery as scheduled.   Rica Mastngela Harith Mccadden, FNP-BC [facility]iology Phone: [phone] 07/26/2016 5:00 PM

## 2016-07-27 ENCOUNTER — Encounter (HOSPITAL_COMMUNITY)
Admission: RE | Disposition: A | Discharge status: Home / Self Care | Payer: Self-pay | Source: Ambulatory Visit | Attending: Orthopaedic Surgery

## 2016-07-27 ENCOUNTER — Ambulatory Visit (HOSPITAL_COMMUNITY)
Admission: RE | Admit: 2016-07-27 | Discharge: 2016-07-27 | Disposition: A | Discharge status: Home / Self Care | Payer: BLUE CROSS/BLUE SHIELD | Source: Ambulatory Visit | Attending: Orthopaedic Surgery | Admitting: Orthopaedic Surgery

## 2016-07-27 ENCOUNTER — Ambulatory Visit (HOSPITAL_COMMUNITY): Payer: BLUE CROSS/BLUE SHIELD | Admitting: Vascular Surgery

## 2016-07-27 ENCOUNTER — Encounter (HOSPITAL_COMMUNITY): Payer: Self-pay | Admitting: *Deleted

## 2016-07-27 DIAGNOSIS — M25562 Pain in left knee: Secondary | ICD-10-CM

## 2016-07-27 DIAGNOSIS — F17200 Nicotine dependence, unspecified, uncomplicated: Secondary | ICD-10-CM | POA: Diagnosis not present

## 2016-07-27 DIAGNOSIS — M1712 Unilateral primary osteoarthritis, left knee: Secondary | ICD-10-CM | POA: Diagnosis not present

## 2016-07-27 DIAGNOSIS — Z79899 Other long term (current) drug therapy: Secondary | ICD-10-CM | POA: Diagnosis not present

## 2016-07-27 DIAGNOSIS — Y929 Unspecified place or not applicable: Secondary | ICD-10-CM | POA: Insufficient documentation

## 2016-07-27 DIAGNOSIS — X58XXXA Exposure to other specified factors, initial encounter: Secondary | ICD-10-CM | POA: Diagnosis not present

## 2016-07-27 DIAGNOSIS — G8929 Other chronic pain: Secondary | ICD-10-CM

## 2016-07-27 DIAGNOSIS — M25572 Pain in left ankle and joints of left foot: Secondary | ICD-10-CM | POA: Diagnosis not present

## 2016-07-27 DIAGNOSIS — R51 Headache: Secondary | ICD-10-CM | POA: Diagnosis not present

## 2016-07-27 DIAGNOSIS — S83242A Other tear of medial meniscus, current injury, left knee, initial encounter: Secondary | ICD-10-CM

## 2016-07-27 DIAGNOSIS — S83242D Other tear of medial meniscus, current injury, left knee, subsequent encounter: Secondary | ICD-10-CM | POA: Diagnosis not present

## 2016-07-27 DIAGNOSIS — Z6841 Body Mass Index (BMI) 40.0 and over, adult: Secondary | ICD-10-CM | POA: Diagnosis not present

## 2016-07-27 HISTORY — DX: Other tear of medial meniscus, current injury, unspecified knee, initial encounter: S83.249A

## 2016-07-27 HISTORY — PX: KNEE ARTHROSCOPY: SHX127

## 2016-07-27 LAB — HCG, SERUM, QUALITATIVE: Preg, Serum: NEGATIVE

## 2016-07-27 LAB — BASIC METABOLIC PANEL
Anion gap: 10 (ref 5–15)
BUN: 11 mg/dL (ref 6–20)
CALCIUM: 9.1 mg/dL (ref 8.9–10.3)
CO2: 22 mmol/L (ref 22–32)
Chloride: 106 mmol/L (ref 101–111)
Creatinine, Ser: 0.93 mg/dL (ref 0.44–1.00)
GFR calc Af Amer: >60 mL/min (ref >60)
GLUCOSE: 115 mg/dL — AB (ref 65–99)
Potassium: 4.1 mmol/L (ref 3.5–5.1)
Sodium: 138 mmol/L (ref 135–145)

## 2016-07-27 LAB — CBC
HCT: 43.6 % (ref 36.0–46.0)
Hemoglobin: 14.7 g/dL (ref 12.0–15.0)
MCH: 30.9 pg (ref 26.0–34.0)
MCHC: 33.7 g/dL (ref 30.0–36.0)
MCV: 91.6 fL (ref 78.0–100.0)
Platelets: 332 10*3/uL (ref 150–400)
RBC: 4.76 MIL/uL (ref 3.87–5.11)
RDW: 13.5 % (ref 11.5–15.5)
WBC: 6.9 10*3/uL (ref 4.0–10.5)

## 2016-07-27 LAB — I-STAT BETA HCG BLOOD, ED (NOT ORDERABLE): I-stat hCG, quantitative: 9.4 m[IU]/mL — ABNORMAL HIGH (ref <5)

## 2016-07-27 SURGERY — ARTHROSCOPY, KNEE
Anesthesia: General | Site: Knee | Laterality: Left

## 2016-07-27 MED ORDER — SODIUM CHLORIDE 0.9 % IR SOLN
Status: DC | PRN
  Administered 2016-07-27: 3000 mL

## 2016-07-27 MED ORDER — MORPHINE SULFATE (PF) 4 MG/ML IV SOLN
INTRAVENOUS | Status: AC
  Filled 2016-07-27: qty 1

## 2016-07-27 MED ORDER — MORPHINE SULFATE (PF) 4 MG/ML IV SOLN
INTRAVENOUS | Status: DC | PRN
  Administered 2016-07-27: 4 mg

## 2016-07-27 MED ORDER — PROPOFOL 10 MG/ML IV BOLUS
INTRAVENOUS | Status: AC
  Filled 2016-07-27: qty 20

## 2016-07-27 MED ORDER — FENTANYL CITRATE (PF) 250 MCG/5ML IJ SOLN
INTRAMUSCULAR | Status: AC
  Filled 2016-07-27: qty 5

## 2016-07-27 MED ORDER — ONDANSETRON HCL 4 MG/2ML IJ SOLN
INTRAMUSCULAR | Status: DC | PRN
  Administered 2016-07-27: 4 mg via INTRAVENOUS

## 2016-07-27 MED ORDER — CHLORHEXIDINE GLUCONATE 4 % EX LIQD: 60.0000 mL | Freq: Once | CUTANEOUS | Status: DC

## 2016-07-27 MED ORDER — FENTANYL CITRATE (PF) 100 MCG/2ML IJ SOLN
INTRAMUSCULAR | Status: DC | PRN
  Administered 2016-07-27 (×3): 50 ug via INTRAVENOUS

## 2016-07-27 MED ORDER — FENTANYL CITRATE (PF) 100 MCG/2ML IJ SOLN
25.0000 ug | INTRAMUSCULAR | Status: DC | PRN
  Administered 2016-07-27 (×2): 25 ug via INTRAVENOUS

## 2016-07-27 MED ORDER — MIDAZOLAM HCL 2 MG/2ML IJ SOLN
INTRAMUSCULAR | Status: AC
  Filled 2016-07-27: qty 2

## 2016-07-27 MED ORDER — CLONIDINE HCL (ANALGESIA) 100 MCG/ML EP SOLN
EPIDURAL | Status: DC | PRN
  Administered 2016-07-27: 10 mL

## 2016-07-27 MED ORDER — BUPIVACAINE HCL (PF) 0.25 % IJ SOLN
INTRAMUSCULAR | Status: AC
  Filled 2016-07-27: qty 30

## 2016-07-27 MED ORDER — BUPIVACAINE HCL (PF) 0.25 % IJ SOLN
INTRAMUSCULAR | Status: DC | PRN
  Administered 2016-07-27: 30 mL

## 2016-07-27 MED ORDER — FENTANYL CITRATE (PF) 100 MCG/2ML IJ SOLN
INTRAMUSCULAR | Status: AC
  Administered 2016-07-27: 25 ug via INTRAVENOUS
  Filled 2016-07-27: qty 2

## 2016-07-27 MED ORDER — CLONIDINE HCL (ANALGESIA) 100 MCG/ML EP SOLN
150.0000 ug | EPIDURAL | Status: DC
  Filled 2016-07-27: qty 1.5

## 2016-07-27 MED ORDER — MIDAZOLAM HCL 5 MG/5ML IJ SOLN
INTRAMUSCULAR | Status: DC | PRN
  Administered 2016-07-27: 2 mg via INTRAVENOUS

## 2016-07-27 MED ORDER — LACTATED RINGERS IV SOLN
INTRAVENOUS | Status: DC
  Administered 2016-07-27 (×2): via INTRAVENOUS

## 2016-07-27 MED ORDER — PROPOFOL 10 MG/ML IV BOLUS
INTRAVENOUS | Status: DC | PRN
  Administered 2016-07-27: 200 mg via INTRAVENOUS
  Administered 2016-07-27 (×2): 50 mg via INTRAVENOUS

## 2016-07-27 MED ORDER — ESMOLOL HCL 100 MG/10ML IV SOLN
INTRAVENOUS | Status: DC | PRN
  Administered 2016-07-27: 50 mg via INTRAVENOUS

## 2016-07-27 MED ORDER — FENTANYL CITRATE (PF) 100 MCG/2ML IJ SOLN: 25.0000 ug | INTRAMUSCULAR | Status: DC | PRN

## 2016-07-27 MED ORDER — HYDROCODONE-ACETAMINOPHEN 5-325 MG PO TABS: 1.0000 | ORAL_TABLET | ORAL | 0 refills | Status: DC | PRN

## 2016-07-27 MED ORDER — DEXTROSE 5 % IV SOLN
3.0000 g | INTRAVENOUS | Status: AC
  Administered 2016-07-27: 3 g via INTRAVENOUS
  Filled 2016-07-27: qty 3000

## 2016-07-27 SURGICAL SUPPLY — 30 items
BANDAGE ACE 6X5 VEL STRL LF (GAUZE/BANDAGES/DRESSINGS) ×7 IMPLANT
BLADE CLIPPER SURG (BLADE) IMPLANT
BLADE CUTTER GATOR 3.5 (BLADE) ×3 IMPLANT
DRAPE ARTHROSCOPY W/POUCH 114 (DRAPES) ×3 IMPLANT
DRAPE U-SHAPE 47X51 STRL (DRAPES) ×3 IMPLANT
DRSG PAD ABDOMINAL 8X10 ST (GAUZE/BANDAGES/DRESSINGS) ×2 IMPLANT
DURAPREP 26ML APPLICATOR (WOUND CARE) ×3 IMPLANT
GAUZE SPONGE 4X4 12PLY STRL (GAUZE/BANDAGES/DRESSINGS) ×3 IMPLANT
GAUZE XEROFORM 1X8 LF (GAUZE/BANDAGES/DRESSINGS) ×3 IMPLANT
GLOVE BIOGEL PI IND STRL 8 (GLOVE) ×2 IMPLANT
GLOVE BIOGEL PI INDICATOR 8 (GLOVE) ×4
GLOVE ORTHO TXT STRL SZ7.5 (GLOVE) ×3 IMPLANT
GLOVE SURG ORTHO 8.0 STRL STRW (GLOVE) ×3 IMPLANT
GOWN STRL REUS W/ TWL LRG LVL3 (GOWN DISPOSABLE) ×2 IMPLANT
GOWN STRL REUS W/ TWL XL LVL3 (GOWN DISPOSABLE) ×4 IMPLANT
GOWN STRL REUS W/TWL LRG LVL3 (GOWN DISPOSABLE) ×3
GOWN STRL REUS W/TWL XL LVL3 (GOWN DISPOSABLE) ×6
KIT ROOM TURNOVER OR (KITS) ×3 IMPLANT
MANIFOLD NEPTUNE II (INSTRUMENTS) ×2 IMPLANT
PACK ARTHROSCOPY DSU (CUSTOM PROCEDURE TRAY) ×3 IMPLANT
PAD ARMBOARD 7.5X6 YLW CONV (MISCELLANEOUS) ×6 IMPLANT
PADDING CAST COTTON 6X4 STRL (CAST SUPPLIES) ×3 IMPLANT
SET ARTHROSCOPY TUBING (MISCELLANEOUS) ×3
SET ARTHROSCOPY TUBING LN (MISCELLANEOUS) ×1 IMPLANT
SPONGE LAP 4X18 X RAY DECT (DISPOSABLE) ×1 IMPLANT
SUT ETHILON 3 0 PS 1 (SUTURE) ×3 IMPLANT
TOWEL OR 17X24 6PK STRL BLUE (TOWEL DISPOSABLE) ×3 IMPLANT
TOWEL OR 17X26 10 PK STRL BLUE (TOWEL DISPOSABLE) ×3 IMPLANT
WAND HAND CNTRL MULTIVAC 90 (MISCELLANEOUS) IMPLANT
WATER STERILE IRR 1000ML POUR (IV SOLUTION) ×3 IMPLANT

## 2016-07-27 NOTE — Op Note (Signed)
NAME:  Shelley Edwards, Shelley Edwards                    ACCOUNT NO.:  MEDICAL RECORD NO.:  [id number]  LOCATION:                                 FACILITY:  PHYSICIAN:  Vanita PandaChristopher Y. Magnus IvanBlackman, M.D.DATE OF BIRTH:  DATE OF PROCEDURE:  07/27/2016 DATE OF DISCHARGE:                              OPERATIVE REPORT   PREOPERATIVE DIAGNOSIS:  Left knee symptomatic medial meniscal tear and moderate arthritis of the medial compartment.  POSTOPERATIVE DIAGNOSIS:  Left knee symptomatic medial meniscal tear and moderate arthritis of the medial compartment.  PROCEDURE:  Left knee arthroscopy with debridement and partial medial meniscectomy.  SURGEON:  Vanita PandaChristopher Y. Magnus IvanBlackman, M.D.  ASSISTANT:  Richardean CanalGilbert Clark, PA-C.  ANESTHESIA: 1. General. 2. Local with mixture of morphine, Marcaine, and clonidine.  BLOOD LOSS:  Minimal.  COMPLICATIONS:  None.  ANTIBIOTICS:  2 g of IV Ancef.  INDICATIONS:  The patient is a 51 year old morbidly obese female with symptomatic left knee medial meniscal tear.  She also has subchondral edema in the medial tibial plateau and moderate arthritic changes on the medial compartment of her knee.  We talked about weight loss and quad strengthening exercises.  She has tried numerous injections in her knee and this point this has detrimentally affected her activities of daily living, her quality of life, her mobility.  She is not interested in knee replacement and would like to try at least an arthroscopic intervention to see if debriding back the meniscal tear and assessing for any loose cartilage in her knee would help her feel better.  She understands that still weight loss will be a key as well as quad strengthening exercises.  PROCEDURE DESCRIPTION:  After informed consent was obtained, appropriate left knee was marked.  She was brought to the operating room, placed supine on the operating table.  General anesthesia was then obtained. Her left thigh, knee, and leg were  prepped and draped with DuraPrep and sterile drapes including a sterile stockinette with the bed raise and the lateral leg post utilized.  The left knee was flexed off the side of the table.  Time-out was called.  She was then identified as correct patient and correct left knee.  I then made an anterolateral arthroscopy portal, inserted a cannula in knee, and found just a mild effusion.  I went to the medial compartment and made an anterior medial incision, inserted a cannula in the knee.  Right away, we could probe the knee and see there was a meniscal root tear and then grade 3 changes in the medial femoral condyle and grade 2 changes of the medial tibial plateau, in terms of cartilage.  There was significant synovitis of the knee as well.  Using arthroscopic shaver, as well as straight biters, I was able to perform partial medial meniscectomy, and then I had to perform a chondroplasty on the medial femoral condyle and the medial tibial plateau.  I assessed ACL and PCL and found them to be intact.  The lateral compartment was assessed and found to have the lateral meniscus intact as well as the popliteus tendon.  There was also some mild cartilage changes there.  Finally, I assessed the patellofemoral  joint that also had grade 3 and grade 4 changes of the trochlear groove and some synovitis in the suprapatellar area which had debrided as well.  I then also performed a chondroplasty in the trochlear groove.  I then allowed fluid to lavage the knee and then drained all fluid from the knee.  We closed the portal sites with interrupted nylon suture.  We did insert a mixture of morphine and Marcaine into the knee itself and the portal sites.  Xeroform and well-padded sterile dressing was applied. She was awakened, extubated, and taken to recovery room in stable condition.  All final counts were correct.  There were no complications noted.     Vanita Panda. Magnus Ivan,  M.D.     CYB/MEDQ  D:  07/27/2016  T:  07/27/2016  Job:  [id number]

## 2016-07-27 NOTE — Anesthesia Postprocedure Evaluation (Signed)
Anesthesia Post Note  Patient: Shelley Edwards  Procedure(s) Performed: Procedure(s) (LRB): LEFT KNEE ARTHROSCOPY WITH PARTIAL MEDIAL MENISCECTOMY (Left)  Patient location during evaluation: PACU Anesthesia Type: General Level of consciousness: awake and alert Pain management: pain level controlled Vital Signs Assessment: post-procedure vital signs reviewed and stable Respiratory status: spontaneous breathing, nonlabored ventilation, respiratory function stable and patient connected to nasal cannula oxygen Cardiovascular status: blood pressure returned to baseline and stable Postop Assessment: no signs of nausea or vomiting Anesthetic complications: no       Last Vitals:  Vitals:   07/27/16 1240 07/27/16 1246  BP: 107/65 96/64  Pulse: (!) 58 (!) 52  Resp: (!) 22 20  Temp: 36.7 C     Last Pain:  Vitals:   07/27/16 1239  TempSrc:   PainSc: 4                  Sheilyn Boehlke EDWARD

## 2016-07-27 NOTE — H&P (Signed)
Shelley Edwards is an 51 y.o. female.   Chief Complaint:   Left knee pain with locking and catching HPI:   51 yo female with worsening left knee pain.  Failed conservative treatment.  A MRI shows a medial meniscal tear and moderate cartilage thinning.  A knee arthroscopy has been recommended at this point given the failure of conservative treatment.  Past Medical History:  Diagnosis Date  . Abnormal uterine bleeding 07/2015  . Acute meniscal tear, medial    left  . H/O atrial flutter   . KNLZJQBH(419.3)     Past Surgical History:  Procedure Laterality Date  . APPENDECTOMY  at age 43  . BACK SURGERY    . CHOLECYSTECTOMY  ~2002  . KNEE SURGERY      Family History  Problem Relation Age of Onset  . Arthritis Unknown   . Diabetes Mother   . Melanoma Mother    Social History:  reports that she has been smoking.  She has never used smokeless tobacco. She reports that she drinks alcohol. She reports that she does not use drugs.  Allergies:  Allergies  Allergen Reactions  . No Known Allergies     Medications Prior to Admission  Medication Sig Dispense Refill  . metoprolol succinate (TOPROL-XL) 25 MG 24 hr tablet Take 25 mg by mouth 2 (two) times daily.   12  . diclofenac (VOLTAREN) 75 MG EC tablet Take 1 tablet (75 mg total) by mouth 2 (two) times daily as needed. (Patient not taking: Reported on 07/25/2016) 40 tablet 0  . ibuprofen (ADVIL,MOTRIN) 200 MG tablet Take 400 mg by mouth daily as needed for headache or moderate pain.      Results for orders placed or performed during the hospital encounter of 07/27/16 (from the past 48 hour(s))  CBC     Status: None   Collection Time: 07/27/16  8:29 AM  Result Value Ref Range   WBC 6.9 4.0 - 10.5 K/uL   RBC 4.76 3.87 - 5.11 MIL/uL   Hemoglobin 14.7 12.0 - 15.0 g/dL   HCT 43.6 36.0 - 46.0 %   MCV 91.6 78.0 - 100.0 fL   MCH 30.9 26.0 - 34.0 pg   MCHC 33.7 30.0 - 36.0 g/dL   RDW 13.5 11.5 - 15.5 %   Platelets 332 150 - 400 K/uL   Basic metabolic panel     Status: Abnormal   Collection Time: 07/27/16  8:29 AM  Result Value Ref Range   Sodium 138 135 - 145 mmol/L   Potassium 4.1 3.5 - 5.1 mmol/L   Chloride 106 101 - 111 mmol/L   CO2 22 22 - 32 mmol/L   Glucose, Bld 115 (H) 65 - 99 mg/dL   BUN 11 6 - 20 mg/dL   Creatinine, Ser 0.93 0.44 - 1.00 mg/dL   Calcium 9.1 8.9 - 10.3 mg/dL   GFR calc non Af Amer >60 >60 mL/min   GFR calc Af Amer >60 >60 mL/min    Comment: (NOTE) The eGFR has been calculated using the CKD EPI equation. This calculation has not been validated in all clinical situations. eGFR's persistently <60 mL/min signify possible Chronic Kidney Disease.    Anion gap 10 5 - 15  hCG, serum, qualitative     Status: None   Collection Time: 07/27/16  8:29 AM  Result Value Ref Range   Preg, Serum NEGATIVE NEGATIVE    Comment:        THE SENSITIVITY OF THIS METHODOLOGY IS >  10 mIU/mL.   I-Stat beta hCG blood, ED     Status: Abnormal   Collection Time: 07/27/16  9:21 AM  Result Value Ref Range   I-stat hCG, quantitative 9.4 (H) <5 mIU/mL   Comment 3            Comment:   GEST. AGE      CONC.  (mIU/mL)   <=1 WEEK        5 - 50     2 WEEKS       50 - 500     3 WEEKS       100 - 10,000     4 WEEKS     1,000 - 30,000        FEMALE AND NON-PREGNANT FEMALE:     LESS THAN 5 mIU/mL    No results found.  Review of Systems  Musculoskeletal: Positive for joint pain.  All other systems reviewed and are negative.   Blood pressure 140/86, pulse 92, temperature 98.6 F (37 C), temperature source Oral, resp. rate 20, height 5' 10"  (1.778 m), weight 280 lb (127 kg), last menstrual period 06/30/2016, SpO2 98 %. Physical Exam  Constitutional: She is oriented to person, place, and time. She appears well-developed and well-nourished.  HENT:  Head: Normocephalic and atraumatic.  Eyes: EOM are normal. Pupils are equal, round, and reactive to light.  Neck: Normal range of motion. Neck supple.  Cardiovascular:  Normal rate and regular rhythm.   Respiratory: Effort normal and breath sounds normal.  GI: Soft. Bowel sounds are normal.  Musculoskeletal:       Left knee: She exhibits decreased range of motion and swelling. Tenderness found. Medial joint line tenderness noted.  Neurological: She is alert and oriented to person, place, and time.  Skin: Skin is warm.  Psychiatric: She has a normal mood and affect.     Assessment/Plan Left knee with symptomatic medial meniscal tear and moderate medial compartment arthritis. 1)  To the OR today for a left knee arthroscopy with debridement and a partial medial meniscectomy.  Risks and benefits have been discussed in detail and informed consent was obtained.  Mcarthur Rossetti, MD 07/27/2016, 10:24 AM

## 2016-07-27 NOTE — Anesthesia Preprocedure Evaluation (Signed)
Anesthesia Evaluation  Patient identified by MRN, date of birth, ID band Patient awake    Reviewed: Allergy & Precautions, H&P , Patient's Chart, lab work & pertinent test results, reviewed documented beta blocker date and time   Airway Mallampati: II  TM Distance: >3 FB Neck ROM: full    Dental no notable dental hx.    Pulmonary Current Smoker,    Pulmonary exam normal breath sounds clear to auscultation       Cardiovascular  Rhythm:regular Rate:Normal     Neuro/Psych    GI/Hepatic   Endo/Other    Renal/GU      Musculoskeletal   Abdominal   Peds  Hematology   Anesthesia Other Findings   Reproductive/Obstetrics                             Anesthesia Physical Anesthesia Plan  ASA: II  Anesthesia Plan: General   Post-op Pain Management:    Induction: Intravenous  Airway Management Planned: LMA  Additional Equipment:   Intra-op Plan:   Post-operative Plan:   Informed Consent: I have reviewed the patients History and Physical, chart, labs and discussed the procedure including the risks, benefits and alternatives for the proposed anesthesia with the patient or authorized representative who has indicated his/her understanding and acceptance.   Dental Advisory Given  Plan Discussed with: CRNA and Surgeon  Anesthesia Plan Comments: ( )        Anesthesia Quick Evaluation  

## 2016-07-27 NOTE — Anesthesia Procedure Notes (Signed)
Procedure Name: LMA Insertion Date/Time: 07/27/2016 10:53 AM Performed by: Fransisca KaufmannMEYER, Joeph Szatkowski E Pre-anesthesia Checklist: Patient identified, Emergency Drugs available, Suction available and Patient being monitored Patient Re-evaluated:Patient Re-evaluated prior to inductionOxygen Delivery Method: Circle System Utilized Preoxygenation: Pre-oxygenation with 100% oxygen Intubation Type: IV induction Ventilation: Mask ventilation without difficulty LMA: LMA inserted LMA Size: 4.0 Number of attempts: 1 Placement Confirmation: positive ETCO2 Tube secured with: Tape Dental Injury: Teeth and Oropharynx as per pre-operative assessment

## 2016-07-27 NOTE — Transfer of Care (Signed)
Immediate Anesthesia Transfer of Care Note  Patient: Janace ArisLaura Henes  Procedure(s) Performed: Procedure(s): LEFT KNEE ARTHROSCOPY WITH PARTIAL MEDIAL MENISCECTOMY (Left)  Patient Location: PACU  Anesthesia Type:General  Level of Consciousness: awake, alert , oriented and sedated  Airway & Oxygen Therapy: Patient Spontanous Breathing and Patient connected to nasal cannula oxygen  Post-op Assessment: Report given to RN, Post -op Vital signs reviewed and stable and Patient moving all extremities  Post vital signs: Reviewed and stable  Last Vitals:  Vitals:   07/27/16 0853  BP: 140/86  Pulse: 92  Resp: 20  Temp: 37 C    Last Pain:  Vitals:   07/27/16 0854  TempSrc:   PainSc: 6       Patients Stated Pain Goal: 6 (07/27/16 0854)  Complications: No apparent anesthesia complications

## 2016-07-27 NOTE — Brief Op Note (Signed)
07/27/2016  11:27 AM  PATIENT:  Shelley Edwards  51 y.o. female  PRE-OPERATIVE DIAGNOSIS:  left knee medial meniscal tear  POST-OPERATIVE DIAGNOSIS:  left knee medial meniscal tear  PROCEDURE:  Procedure(s): LEFT KNEE ARTHROSCOPY WITH PARTIAL MEDIAL MENISCECTOMY (Left)  SURGEON:  Surgeon(s) and Role:    Kathryne Hitch* Ayse Mccartin Y, MD - Primary  PHYSICIAN ASSISTANT: Rexene EdisonGil Clark, PA-C  ANESTHESIA:   local and general  EBL:  Total I/O In: -  Out: 10 [Blood:10]  COUNTS:  YES  TOURNIQUET:  * No tourniquets in log *  DICTATION: .Other Dictation: Dictation Number [id number]  PLAN OF CARE: Discharge to home after PACU  PATIENT DISPOSITION:  PACU - hemodynamically stable.   Delay start of Pharmacological VTE agent (>24hrs) due to surgical blood loss or risk of bleeding: no

## 2016-07-27 NOTE — Discharge Instructions (Signed)
Increase your activities as comfort allows. You may put full weight on your left knee. Expect swelling - ice and elevation as needed. Do pump your feet occasionally throughout the day. You can remove your dressings tomorrow and get your incisions wet in the shower. Place small band-aides over your incisions daily after your shower.

## 2016-07-28 ENCOUNTER — Encounter (HOSPITAL_COMMUNITY): Payer: Self-pay | Admitting: Orthopaedic Surgery

## 2016-08-03 ENCOUNTER — Inpatient Hospital Stay (INDEPENDENT_AMBULATORY_CARE_PROVIDER_SITE_OTHER): Payer: BLUE CROSS/BLUE SHIELD | Admitting: Physician Assistant

## 2016-08-03 ENCOUNTER — Ambulatory Visit (INDEPENDENT_AMBULATORY_CARE_PROVIDER_SITE_OTHER): Payer: BLUE CROSS/BLUE SHIELD | Admitting: Orthopaedic Surgery

## 2016-08-03 DIAGNOSIS — Z9889 Other specified postprocedural states: Secondary | ICD-10-CM | POA: Insufficient documentation

## 2016-08-03 DIAGNOSIS — S83242A Other tear of medial meniscus, current injury, left knee, initial encounter: Secondary | ICD-10-CM

## 2016-08-03 NOTE — Progress Notes (Signed)
The patient is following up 1 week after left knee arthroscopy. We performed a partial medial meniscectomy. We did find grade 3 cartilage changes on the medial femoral condyle. She says that she is doing a little bit better since surgery but certainly has pain in that knee. She's been dealing with pain for at least 2 or more months preoperative is well.  On examination there is a mild effusion of her left knee which is got good range of motion overall. Sutures and then removed in his nose infection. The knee feels ligamentously stable.  We went over her arthroscopy pictures in a knee model and talked in detail what is going on with her knee. She'll work on quad strengthening exercises at this point. Given the chondromalacia that medial compartment she is a perfect candidate for hyaluronic acid. I talked her about this in detail and she does wish that to be her next step. Hopefully we'll be able to have this approved by insurance and her next visit in 4 weeks place a hyaluronic acid injection into her left knee.

## 2016-08-04 ENCOUNTER — Encounter: Payer: Self-pay | Admitting: Cardiology

## 2016-08-04 DIAGNOSIS — I4892 Unspecified atrial flutter: Secondary | ICD-10-CM | POA: Insufficient documentation

## 2016-08-04 NOTE — Consult Note (Signed)
Ballard, Antwonette    Date of visit:  07/18/2016 DOB:  13-Mar-1965    Age:  51 yrs. Medical record number:  [id number]     Account number:  [id number] Primary Care Provider: Kathlene November ____________________________ CURRENT DIAGNOSES  1. Encounter for preprocedural cardiovascular examination  2. Atrial flutter, typical  3. Morbid (severe) obesity ____________________________ ALLERGIES  No Known Allergies ____________________________ MEDICATIONS  1. metoprolol succinate ER 25 mg tablet,extended release 24 hr, 1 p.o. daily ____________________________ CHIEF COMPLAINTS  preop evaluation ____________________________ HISTORY OF PRESENT ILLNESS This 51 year old female is seen for preoperative cardiac evaluation and evaluation of atrial flutter. The patient has a history of severe morbid obesity and was in need of arthroscopic surgery on her knee for an acute meniscal tear which has kept her from walking. She has no prior cardiac history he denies hypertension or diabetes. She was supposed to have knee surgery but on the morning of surgery at the [facility] she was found to be in atrial flutter with a rate of 144 the surgery was canceled. An appointment was scheduled to see me and she is in sinus rhythm today. She was unaware that her heart was out of rhythm and has previously never been told of a prior cardiac arrhythmia. She is not aware of palpitations syncope or dizziness. She denies angina and has no PND, orthopnea or claudication. She does have some mild edema that affects her left leg and has had Doppler studies on this leg previously. ____________________________ PAST HISTORY  Past Medical Illnesses:  obesity, denies hypertension or diabetes;  Cardiovascular Illnesses:  no previous history of cardiac disease;  Infectious Diseases:  no previous history of significant infectious diseases;  Surgical Procedures:  lumbar laminectomy, cholecystectomy, arthroscopic knee surgery, appendectomy;  Trauma  History:  no previous history of significant trauma;  NYHA Classification:  I;  Cardiology Procedures-Invasive:  no previous interventional or invasive cardiology procedures;  Cardiology Procedures-Noninvasive:  no previous non-invasive cardiovascular testing;  Peripheral Vascular Procedures:  no previous invasive peripheral vascular procedures.;  LVEF not documented,   ____________________________ CARDIO-PULMONARY TEST DATES EKG Date:  07/18/2016;  Chest Xray Date: 06/24/2014;   ____________________________ FAMILY HISTORY Father -- Father dead, Diabetes mellitus Mother -- Mother dead, Multiple myeloma Sister -- Sister alive with problem, Thyroid disorder ____________________________ SOCIAL HISTORY Alcohol Use:  socially;  Smoking:  smokes cigarettes, occasionally;  Diet:  regular diet;  Lifestyle:  married and 2 sons;  Exercise:  exercises regularly;  Occupation:  Youth worker;  Residence:  lives with husband;   ____________________________ REVIEW OF SYSTEMS General:  obesity  Integumentary:no rashes or new skin lesions. Eyes: denies diplopia, history of glaucoma or visual problems. Ears, Nose, Throat, Mouth:  denies any hearing loss, epistaxis, hoarseness or difficulty speaking. Respiratory: denies dyspnea, cough, wheezing or hemoptysis. Cardiovascular:  please review HPI Abdominal: denies dyspepsia, GI bleeding, constipation, or diarrheaGenitourinary-Female: no dysuria, urgency, frequency, UTIs, or stress incontinence Musculoskeletal:  edema, arthritis of the knee Neurological:  denies headaches, stroke, or TIA Psychiatric:  denies depression or anxiety Hematological/Immunologic:  denies any food allergies, bleeding disorders. ____________________________ PHYSICAL EXAMINATION VITAL SIGNS  Blood Pressure:  130/92 Standing, Left arm, large cuff  , 120/90 Sitting, Left arm and large cuff   Pulse:  68/min. Weight:  288.00 lbs. Height:  69"BMI: 42  Constitutional:  pleasant white female, in no  acute distress, severely obese Skin:  warm and dry to touch, no apparent skin lesions, or masses noted. Head:  normocephalic, normal hair pattern, no masses  or tenderness Eyes:  EOMS Intact, PERRLA, C and S clear, Funduscopic exam not done. ENT:  ears, nose and throat reveal no gross abnormalities.  Dentition good. Neck:  supple, without massess. No JVD, thyromegaly or carotid bruits. Carotid upstroke normal. Chest:  normal symmetry, clear to auscultation. Cardiac:  regular rhythm, normal S1 and S2, No S3 or S4, no murmurs, gallops or rubs detected. Abdomen:  abdomen soft, non-tender, severely obese Peripheral Pulses:  pulses full and equal in all extremities Extremities & Back:  no deformities, clubbing, cyanosis, erythema or edema observed. Normal muscle strength and tone. Neurological:  no gross motor or sensory deficits noted, affect appropriate, oriented x3. ____________________________ IMPRESSIONS/PLAN  1. Typical atrial flutter paroxysmal which has resolved 2. Severe morbid obesity 3. From a cardiovascular viewpoint I think she can proceed with the arthroscopic surgery on her knee. Her CHA2DS2VASC score is only wants I don't think that she needs to be anticoagulated for her atrial flutter. I have recommended that she initiate metoprolol 25 mg once daily and consider doing her surgery in the hospital in case she has atrial flutter during surgery where it could be managed and observed afterwards a little easier.  Recommendations:  1. Obtain echocardiogram 2. Begin metoprolol 25 mg daily 3. Wear a cardiac event monitor to determine if she is having any silent atrial fibrillation and follow up afterwards. 4. Discussed atrial flutter with her and also the importance of weight loss to prevent recurrences in the future. We also briefly did discuss that if she has recurrent arrhythmias she could be a candidate for interventional treatment of them. 5. Obtain  TSH ____________________________ TODAYS ORDERS  1. 2D, color flow, doppler: First Available  2. TSH: Today  3. 12 Lead EKG: Today  4. [place]: Today  5. Return Visit: 1 month                       ____________________________ Cardiology Physician:  Kerry Hough MD [facility]

## 2016-08-07 ENCOUNTER — Ambulatory Visit (INDEPENDENT_AMBULATORY_CARE_PROVIDER_SITE_OTHER): Payer: BLUE CROSS/BLUE SHIELD | Admitting: Internal Medicine

## 2016-08-07 ENCOUNTER — Encounter: Payer: Self-pay | Admitting: Internal Medicine

## 2016-08-07 VITALS — BP 118/82 | HR 67 | Ht 69.0 in | Wt 290.0 lb

## 2016-08-07 DIAGNOSIS — Z79899 Other long term (current) drug therapy: Secondary | ICD-10-CM | POA: Diagnosis not present

## 2016-08-07 DIAGNOSIS — I4892 Unspecified atrial flutter: Secondary | ICD-10-CM | POA: Diagnosis not present

## 2016-08-07 MED ORDER — METOPROLOL SUCCINATE ER 25 MG PO TB24: 25.0000 mg | ORAL_TABLET | Freq: Two times a day (BID) | ORAL | 3 refills | Status: DC

## 2016-08-07 MED ORDER — FLECAINIDE ACETATE 150 MG PO TABS: 75.0000 mg | ORAL_TABLET | Freq: Two times a day (BID) | ORAL | 3 refills | Status: DC

## 2016-08-07 NOTE — Progress Notes (Signed)
HPI Shelley Edwards is referred today by Dr. Donnie [facility]hoilley and [facility] for evaluation of atrial fibrillation and flutter. She is a pleasant 51 yo woman who has a CHADSVASC score of 1 and who has had fairly mild palpitations who was found to have atrial flutter and fibrillation on ECG and cardiac monitoring. Evaluation with a cardiac monitor shows both arrhythmias and she has had a 2 D echo demonstrating normal LV function with mild LA enlargement. She has not had syncope. She denies chest pain or sob. When she is out of rhythm her ventricular rate is reasonably well controlled. She was placed on beta blocker therapy. She denies sleep apnea symptoms. She has recently undergone arthroscopic knee surgery.  No Known Allergies   Current Outpatient Prescriptions  Medication Sig Dispense Refill  . flecainide (TAMBOCOR) 150 MG tablet Take 0.5 tablets (75 mg total) by mouth 2 (two) times daily. 30 tablet 3  . metoprolol succinate (TOPROL-XL) 25 MG 24 hr tablet Take 1 tablet (25 mg total) by mouth 2 (two) times daily. Take with or immediately following a meal. 60 tablet 3   No current facility-administered medications for this visit.      Past Medical History:  Diagnosis Date  . Abnormal uterine bleeding 07/2015  . Acute meniscal tear, medial    left  . H/O atrial flutter   . Headache(784.0)     ROS:   All systems reviewed and negative except as noted in the HPI.   Past Surgical History:  Procedure Laterality Date  . APPENDECTOMY  at age 51  . BACK SURGERY    . CHOLECYSTECTOMY  ~2002  . KNEE ARTHROSCOPY Left 07/27/2016   Procedure: LEFT KNEE ARTHROSCOPY WITH PARTIAL MEDIAL MENISCECTOMY;  Surgeon: Kathryne HitchBlackman, Christopher Y, MD;  Location: [facility] OR;  Service: Orthopedics;  Laterality: Left;  . KNEE SURGERY       Family History  Problem Relation Age of Onset  . Arthritis Unknown   . Diabetes Mother   . Melanoma Mother      Social History   Social History  . Marital status: Married   Spouse name: N/A  . Number of children: N/A  . Years of education: N/A   Occupational History  . Not on file.   Social History Main Topics  . Smoking status: Current Some Day Smoker  . Smokeless tobacco: Never Used  . Alcohol use Yes     Comment: occ  . Drug use: No  . Sexual activity: Yes    Birth control/ protection: Condom   Other Topics Concern  . Not on file   Social History Narrative  . No narrative on file     BP 118/82   Pulse 67   Ht 5\' 9"  (1.753 m)   Wt 290 lb (131.5 kg)   SpO2 97%   BMI 42.83 kg/m   Physical Exam:  Well appearing but obese middle age woman, NAD HEENT: Unremarkable Neck:  6 cm JVD, no thyromegally Lymphatics:  No adenopathy Back:  No CVA tenderness Lungs:  Clear with no wheezes HEART:  Regular rate rhythm, no murmurs, no rubs, no clicks Abd:  soft, obese, positive bowel sounds, no organomegally, no rebound, no guarding Ext:  2 plus pulses, trace edema left leg, no cyanosis, no clubbing Skin:  No rashes no nodules Neuro:  CN II through XII intact, motor grossly intact  EKG - reviewed  Assess/Plan: 1. Atrial fib - she has clear evidence of this with a RVR and  a CVR. I have recommended she start flecainide. Her stroke risk is low. We discussed the various treatment options in detail.  2. Atrial flutter - she has a RVR and CVR. I have recommended she take flecainide in conjunction with a beta blocker. If she has break through symptoms then we might consider performing a catheter ablation of her flutter if her atrial fib is quiet and controlled on the flecainide.  3. Obesity - we discussed how her weight gain may be playing a role in her pre-disposition to having her heart go out of rhythm. I have asked her to try and lose weight. 4. Dyspnea - this is fairly mild. I have asked her to reduce her salt intake, lose weight and hopefully her symptoms will improve. She is still working.   Leonia Reeves.D.

## 2016-08-07 NOTE — Patient Instructions (Addendum)
Medication Instructions:  Your physician has recommended you make the following change in your medication:  START Flecainide 75 mg (1/2 tablet) twice daily DECREASE Toprol XL to 25 mg twice daily   Labwork: None Ordered   Testing/Procedures: EKG Nurse Visit - 1 week   Follow-Up: Your physician recommends that you schedule a follow-up appointment in: 5 weeks with Dr. Ladona Ridgelaylor   Any Other Special Instructions Will Be Listed Below (If Applicable).     If you need a refill on your cardiac medications before your next appointment, please call your pharmacy.

## 2016-08-09 ENCOUNTER — Telehealth: Payer: Self-pay

## 2016-08-14 ENCOUNTER — Ambulatory Visit (INDEPENDENT_AMBULATORY_CARE_PROVIDER_SITE_OTHER): Payer: BLUE CROSS/BLUE SHIELD | Admitting: Nurse Practitioner

## 2016-08-14 ENCOUNTER — Telehealth: Payer: Self-pay

## 2016-08-14 VITALS — BP 112/84 | HR 132 | Ht 69.0 in | Wt 292.5 lb

## 2016-08-14 DIAGNOSIS — I4892 Unspecified atrial flutter: Secondary | ICD-10-CM | POA: Diagnosis not present

## 2016-08-14 NOTE — Telephone Encounter (Signed)
error 

## 2016-08-14 NOTE — Progress Notes (Signed)
1.) Reason for visit: EKG for flecainide start on 08/07/16  2.) Name of MD requesting visit: Dr. Ladona Ridgelaylor  3.) H&P: Hx of atrial flutter, started on flecainide 37.5 mg twice daily at last ov with Dr. Ladona Ridgelaylor on 6/4; patient takes Toprol 25 BID  4.) ROS related to problem: patient denies complaints; EKG reveals atrial flutter with rate of 132 bpm; BP 112/84 mmHg;   5.) Assessment and plan per MD: continue current medications and follow-up next week with Dr. Ladona Ridgelaylor to discuss atrial flutter ablation  Patient advised to return on 6/22 at 2:45

## 2016-08-14 NOTE — Telephone Encounter (Signed)
Called, spoke with pt. Scheduled nurse visit EKG for today at 3:45 PM.

## 2016-08-14 NOTE — Patient Instructions (Signed)
Medication Instructions:  Your physician recommends that you continue on your current medications as directed. Please refer to the Current Medication list given to you today.   Labwork: None Ordered   Testing/Procedures: None Ordered   Follow-Up: Your physician recommends you return for follow-up with Dr. Ladona Ridgelaylor on Friday June 22 at 2:45  If you need a refill on your cardiac medications before your next appointment, please call your pharmacy.   Thank you for choosing [facility]! Eligha BridegroomMichelle Swinyer, RN [phone]

## 2016-08-17 ENCOUNTER — Telehealth: Payer: Self-pay | Admitting: Internal Medicine

## 2016-08-17 NOTE — Telephone Encounter (Signed)
Called, spoke with pt. Informed EKG appt cancelled for tomorrow (08/18/16). Pt is scheduled with Dr. Ladona Ridgelaylor for f/u ov on 08/25/16 at 2:45 PM, arriving at 2:30 PM. Pt verbalized understanding.

## 2016-08-17 NOTE — Telephone Encounter (Signed)
New message    Pt is calling about her EKG appt on Friday. She said she had one on Monday, does she need this appt also? She said she can't come on Friday.

## 2016-08-18 ENCOUNTER — Telehealth (INDEPENDENT_AMBULATORY_CARE_PROVIDER_SITE_OTHER): Payer: Self-pay | Admitting: *Deleted

## 2016-08-18 ENCOUNTER — Ambulatory Visit: Payer: BLUE CROSS/BLUE SHIELD

## 2016-08-18 NOTE — Telephone Encounter (Signed)
Faxed signed script to number provided on [employer] portal

## 2016-08-18 NOTE — Telephone Encounter (Signed)
[facility] Dec[facility] called asking for signed enrollment form to be faxed back to the number on the form.

## 2016-08-25 ENCOUNTER — Encounter: Payer: Self-pay | Admitting: Internal Medicine

## 2016-08-25 ENCOUNTER — Ambulatory Visit (INDEPENDENT_AMBULATORY_CARE_PROVIDER_SITE_OTHER): Payer: BLUE CROSS/BLUE SHIELD | Admitting: Internal Medicine

## 2016-08-25 VITALS — HR 136 | Ht 69.0 in | Wt 288.0 lb

## 2016-08-25 DIAGNOSIS — Z01812 Encounter for preprocedural laboratory examination: Secondary | ICD-10-CM | POA: Diagnosis not present

## 2016-08-25 DIAGNOSIS — I4892 Unspecified atrial flutter: Secondary | ICD-10-CM | POA: Diagnosis not present

## 2016-08-25 MED ORDER — APIXABAN 5 MG PO TABS: 5.0000 mg | ORAL_TABLET | Freq: Two times a day (BID) | ORAL | 3 refills | Status: DC

## 2016-08-25 MED ORDER — FLECAINIDE ACETATE 150 MG PO TABS: 75.0000 mg | ORAL_TABLET | Freq: Two times a day (BID) | ORAL | 2 refills | Status: DC

## 2016-08-25 NOTE — Patient Instructions (Addendum)
Medication Instructions:  Your physician has recommended you make the following change in your medication:  START Eliquis 5 mg twice daily  Labwork: Lab appointment for BMET/CBC on 09/15/16  Testing/Procedures: Your physician has recommended that you have an ablation. Catheter ablation is a medical procedure used to treat some cardiac arrhythmias (irregular heartbeats). During catheter ablation, a long, thin, flexible tube is put into a blood vessel in your groin (upper thigh), or neck. This tube is called an ablation catheter. It is then guided to your heart through the blood vessel. Radio frequency waves destroy small areas of heart tissue where abnormal heartbeats may cause an arrhythmia to start. Please see the instruction sheet given to you today.    Follow-Up: Follow-up appointment and 4-6 weeks with Dr. Ladona Ridgelaylor.   Any Other Special Instructions Will Be Listed Below (If Applicable).  [facility]  On 09/20/16 at 8:30 AM  Nothing to eat or drink after midnight prior to procedure  Do NOT take any medication prior to procedure (do not take Eliqus the morning prior to procedure)   Plan 1 night stay OR someone to drive you home after the procedure    If you need a refill on your cardiac medications before your next appointment, please call your pharmacy.

## 2016-08-25 NOTE — Progress Notes (Signed)
HPI Shelley Edwards returns today for followup after developing atrial flutter. She is a pleasant 51 yo woman who has a CHADSVASC score of 1 and who has had fairly mild palpitations who was found to have atrial flutter and fibrillation on ECG and cardiac monitoring. Evaluation with a cardiac monitor shows both arrhythmias and she has had a 2 D echo demonstrating normal LV function with mild LA enlargement. She has not had syncope. I recommended she start flecainide as she had both arrhythmias and her atrial fib has resolved but she has developed atrial flutter with a RVR. She does not have palpitations and she does not feel her atrial flutter unless she tries to do something strenuous.   No Known Allergies   Current Outpatient Prescriptions  Medication Sig Dispense Refill  . flecainide (TAMBOCOR) 150 MG tablet Take 0.5 tablets (75 mg total) by mouth 2 (two) times daily. 90 tablet 2  . metoprolol succinate (TOPROL-XL) 25 MG 24 hr tablet Take 1 tablet (25 mg total) by mouth 2 (two) times daily. Take with or immediately following a meal. 60 tablet 3  . apixaban (ELIQUIS) 5 MG TABS tablet Take 1 tablet (5 mg total) by mouth 2 (two) times daily. 60 tablet 3   No current facility-administered medications for this visit.      Past Medical History:  Diagnosis Date  . Abnormal uterine bleeding 07/2015  . Acute meniscal tear, medial    left  . H/O atrial flutter   . Headache(784.0)     ROS:   All systems reviewed and negative except as noted in the HPI.   Past Surgical History:  Procedure Laterality Date  . APPENDECTOMY  at age 89  . BACK SURGERY    . CHOLECYSTECTOMY  ~2002  . KNEE ARTHROSCOPY Left 07/27/2016   Procedure: LEFT KNEE ARTHROSCOPY WITH PARTIAL MEDIAL MENISCECTOMY;  Surgeon: Kathryne HitchBlackman, Christopher Y, MD;  Location: [facility] OR;  Service: Orthopedics;  Laterality: Left;  . KNEE SURGERY       Family History  Problem Relation Age of Onset  . Arthritis Unknown   . Diabetes  Mother   . Melanoma Mother      Social History   Social History  . Marital status: Married    Spouse name: N/A  . Number of children: N/A  . Years of education: N/A   Occupational History  . Not on file.   Social History Main Topics  . Smoking status: Light Tobacco Smoker  . Smokeless tobacco: Never Used  . Alcohol use Yes     Comment: occ  . Drug use: No  . Sexual activity: Yes    Birth control/ protection: Condom   Other Topics Concern  . Not on file   Social History Narrative  . No narrative on file     Pulse (!) 136   Ht 5\' 9"  (1.753 m)   Wt 288 lb (130.6 kg)   SpO2 98%   BMI 42.53 kg/m   Physical Exam:  Well appearing but obese middle age woman, NAD HEENT: Unremarkable Neck:  6 cm JVD, no thyromegally Lymphatics:  No adenopathy Back:  No CVA tenderness Lungs:  Clear with no wheezes HEART:  Regular tachy rhythm, no murmurs, no rubs, no clicks Abd:  soft, obese, positive bowel sounds, no organomegally, no rebound, no guarding Ext:  2 plus pulses, trace edema left leg, no cyanosis, no clubbing Skin:  No rashes no nodules Neuro:  CN II through XII intact, motor  grossly intact  EKG - reviewed  Assess/Plan: 1. Atrial fib - she has clear evidence of this with a RVR and a CVR. I have recommended she start flecainide. Her stroke risk is low. We discussed the various treatment options in detail.  2. Atrial flutter - she has developed incessant atrial flutter. I have recommended she continue the flecainide and undergo EP study and catheter ablation of atrial flutter. I also discussed options of therapy including atrial fib and flutter ablation. For now she would like to proceed with atrial flutter ablation. She will be started on Eliquis today and she will not need a TEE as we plan to perform ablation after 3 weeks of anti-coagulation. 3. Obesity - we discussed how her weight gain may be playing a role in her pre-disposition to having her heart go out of rhythm. I  have asked her to try and lose weight. 4. Dyspnea - this is fairly mild. I have asked her to redu ce her salt intake, lose weight and hopefully her symptoms will improve. She is still working.   Shelley Edwards,M.D.

## 2016-09-07 ENCOUNTER — Other Ambulatory Visit (INDEPENDENT_AMBULATORY_CARE_PROVIDER_SITE_OTHER): Payer: Self-pay

## 2016-09-07 ENCOUNTER — Ambulatory Visit (INDEPENDENT_AMBULATORY_CARE_PROVIDER_SITE_OTHER): Payer: BLUE CROSS/BLUE SHIELD | Admitting: Orthopaedic Surgery

## 2016-09-07 DIAGNOSIS — Z9889 Other specified postprocedural states: Secondary | ICD-10-CM

## 2016-09-07 DIAGNOSIS — G8929 Other chronic pain: Secondary | ICD-10-CM

## 2016-09-07 DIAGNOSIS — M25562 Pain in left knee: Secondary | ICD-10-CM

## 2016-09-07 NOTE — Progress Notes (Signed)
The patient is a 51 year old well-known to me. She is about 6-7 weeks out from a knee arthroscopy with significant cartilage changes in her left knee. She is here today for a hyaluronic acid injection of Synvisc 1 in the left knee. Since I saw her last she has been diagnosed with atrial fibrillation and atrial flutter and is scheduled to have a cardiac ablation later this month. She is on Elaquis and is been on this blood thinner for about 2 weeks now. Her biggest concern though is swelling in her foot and calf and pain in the calf.  On examination she does have swelling in her left foot. She does have swelling in her calf as well and her knee. I was able to place the hyaluronic acid injection Synvisc 1 in her left knee without difficulty. Given the swelling in her foot and the pain in her calf I feel obligated to obtain a Doppler ultrasound of the left lower extremity to rule out a DVT. She'll continue her Elaquis in the interim. Do not need to see her back myself for 2 months.

## 2016-09-08 ENCOUNTER — Ambulatory Visit (HOSPITAL_BASED_OUTPATIENT_CLINIC_OR_DEPARTMENT_OTHER)
Admission: RE | Admit: 2016-09-08 | Discharge: 2016-09-08 | Disposition: A | Discharge status: Home / Self Care | Payer: BLUE CROSS/BLUE SHIELD | Source: Ambulatory Visit | Attending: Orthopaedic Surgery | Admitting: Orthopaedic Surgery

## 2016-09-08 ENCOUNTER — Ambulatory Visit (HOSPITAL_COMMUNITY): Admission: RE | Admit: 2016-09-08 | Payer: BLUE CROSS/BLUE SHIELD | Source: Ambulatory Visit

## 2016-09-08 DIAGNOSIS — M25562 Pain in left knee: Secondary | ICD-10-CM | POA: Diagnosis present

## 2016-09-08 DIAGNOSIS — M7989 Other specified soft tissue disorders: Secondary | ICD-10-CM | POA: Insufficient documentation

## 2016-09-08 DIAGNOSIS — M7122 Synovial cyst of popliteal space [Baker], left knee: Secondary | ICD-10-CM | POA: Diagnosis not present

## 2016-09-08 DIAGNOSIS — Z9889 Other specified postprocedural states: Secondary | ICD-10-CM

## 2016-09-11 ENCOUNTER — Ambulatory Visit: Payer: BLUE CROSS/BLUE SHIELD | Admitting: Internal Medicine

## 2016-09-15 ENCOUNTER — Other Ambulatory Visit: Payer: BLUE CROSS/BLUE SHIELD

## 2016-09-15 NOTE — Addendum Note (Signed)
Addendum  created 09/15/16 1309 by Cem Kosman, MD   Sign clinical note    

## 2016-09-15 NOTE — Anesthesia Postprocedure Evaluation (Signed)
Anesthesia Post Note  Patient: Shelley Edwards  Procedure(s) Performed: Procedure(s) (LRB): LEFT KNEE ARTHROSCOPY WITH PARTIAL MEDIAL MENISCECTOMY (Left)     Anesthesia Post Evaluation  Last Vitals:  Vitals:   07/27/16 1240 07/27/16 1246  BP: 107/65 96/64  Pulse: (!) 58 (!) 52  Resp: (!) 22 20  Temp: 36.7 C     Last Pain:  Vitals:   07/27/16 1239  TempSrc:   PainSc: 4                  Zakirah Weingart EDWARD

## 2016-09-18 ENCOUNTER — Other Ambulatory Visit: Payer: BLUE CROSS/BLUE SHIELD | Admitting: *Deleted

## 2016-09-18 ENCOUNTER — Telehealth: Payer: Self-pay | Admitting: Internal Medicine

## 2016-09-18 DIAGNOSIS — I4892 Unspecified atrial flutter: Secondary | ICD-10-CM | POA: Diagnosis not present

## 2016-09-18 DIAGNOSIS — Z01812 Encounter for preprocedural laboratory examination: Secondary | ICD-10-CM | POA: Diagnosis not present

## 2016-09-18 NOTE — Telephone Encounter (Signed)
Called, pt unavailable. Left detailed voice msg (per DPR). Informed procedure is scheduled for 09/20/16, pt arriving to the hospital at 8:30 AM. Informed to please call our office with further questions of concerns.

## 2016-09-18 NOTE — Telephone Encounter (Signed)
New message    Pt is calling to find out when she should arrive at the hospital on Wednesday.

## 2016-09-19 ENCOUNTER — Telehealth (INDEPENDENT_AMBULATORY_CARE_PROVIDER_SITE_OTHER): Payer: Self-pay

## 2016-09-19 LAB — CBC WITH DIFFERENTIAL
BASOS: 1 %
Basophils Absolute: 0 10*3/uL (ref 0.0–0.2)
EOS (ABSOLUTE): 0.2 10*3/uL (ref 0.0–0.4)
EOS: 3 %
HEMOGLOBIN: 12.8 g/dL (ref 11.1–15.9)
Hematocrit: 38.2 % (ref 34.0–46.6)
Immature Grans (Abs): 0 10*3/uL (ref 0.0–0.1)
Immature Granulocytes: 0 %
Lymphocytes Absolute: 2 10*3/uL (ref 0.7–3.1)
Lymphs: 31 %
MCH: 30.7 pg (ref 26.6–33.0)
MCHC: 33.5 g/dL (ref 31.5–35.7)
MCV: 92 fL (ref 79–97)
MONOCYTES: 9 %
MONOS ABS: 0.6 10*3/uL (ref 0.1–0.9)
Neutrophils Absolute: 3.6 10*3/uL (ref 1.4–7.0)
Neutrophils: 56 %
RBC: 4.17 x10E6/uL (ref 3.77–5.28)
RDW: 13.9 % (ref 12.3–15.4)
WBC: 6.5 10*3/uL (ref 3.4–10.8)

## 2016-09-19 LAB — BASIC METABOLIC PANEL
BUN / CREAT RATIO: 16 (ref 9–23)
BUN: 11 mg/dL (ref 6–24)
CALCIUM: 8.9 mg/dL (ref 8.7–10.2)
CO2: 24 mmol/L (ref 20–29)
CREATININE: 0.68 mg/dL (ref 0.57–1.00)
Chloride: 101 mmol/L (ref 96–106)
GFR calc non Af Amer: 102 mL/min/{1.73_m2} (ref >59)
GFR, EST AFRICAN AMERICAN: 118 mL/min/{1.73_m2} (ref >59)
Glucose: 103 mg/dL — ABNORMAL HIGH (ref 65–99)
Potassium: 4.7 mmol/L (ref 3.5–5.2)
Sodium: 139 mmol/L (ref 134–144)

## 2016-09-19 NOTE — Telephone Encounter (Signed)
Called LM for patient advising that insurance requires us to go through specialty pharmacy. Advised her that I s/w [facility] today and that injection should be shipped to us by 09/21/16

## 2016-09-20 ENCOUNTER — Encounter (HOSPITAL_COMMUNITY)
Admission: RE | Disposition: A | Discharge status: Home / Self Care | Payer: Self-pay | Source: Ambulatory Visit | Attending: Internal Medicine

## 2016-09-20 ENCOUNTER — Encounter (HOSPITAL_COMMUNITY): Payer: Self-pay | Admitting: *Deleted

## 2016-09-20 ENCOUNTER — Ambulatory Visit (HOSPITAL_COMMUNITY)
Admission: RE | Admit: 2016-09-20 | Discharge: 2016-09-21 | Disposition: A | Discharge status: Home / Self Care | Payer: BLUE CROSS/BLUE SHIELD | Source: Ambulatory Visit | Attending: Internal Medicine | Admitting: Internal Medicine

## 2016-09-20 DIAGNOSIS — E669 Obesity, unspecified: Secondary | ICD-10-CM | POA: Insufficient documentation

## 2016-09-20 DIAGNOSIS — I4892 Unspecified atrial flutter: Secondary | ICD-10-CM | POA: Diagnosis present

## 2016-09-20 DIAGNOSIS — Z7901 Long term (current) use of anticoagulants: Secondary | ICD-10-CM | POA: Diagnosis not present

## 2016-09-20 DIAGNOSIS — I480 Paroxysmal atrial fibrillation: Secondary | ICD-10-CM | POA: Insufficient documentation

## 2016-09-20 DIAGNOSIS — I483 Typical atrial flutter: Secondary | ICD-10-CM | POA: Insufficient documentation

## 2016-09-20 DIAGNOSIS — Z6841 Body Mass Index (BMI) 40.0 and over, adult: Secondary | ICD-10-CM | POA: Insufficient documentation

## 2016-09-20 DIAGNOSIS — F17210 Nicotine dependence, cigarettes, uncomplicated: Secondary | ICD-10-CM | POA: Diagnosis not present

## 2016-09-20 HISTORY — PX: A-FLUTTER ABLATION: EP1230

## 2016-09-20 LAB — PLATELET COUNT: Platelets: 305 10*3/uL (ref 150–400)

## 2016-09-20 SURGERY — A-FLUTTER ABLATION

## 2016-09-20 MED ORDER — SODIUM CHLORIDE 0.9% FLUSH: 3.0000 mL | INTRAVENOUS | Status: DC | PRN

## 2016-09-20 MED ORDER — SODIUM CHLORIDE 0.9 % IV SOLN
INTRAVENOUS | Status: DC
  Administered 2016-09-20: 10:00:00 via INTRAVENOUS

## 2016-09-20 MED ORDER — MIDAZOLAM HCL 5 MG/5ML IJ SOLN
INTRAMUSCULAR | Status: AC
  Filled 2016-09-20: qty 5

## 2016-09-20 MED ORDER — BUPIVACAINE HCL (PF) 0.25 % IJ SOLN
INTRAMUSCULAR | Status: AC
  Filled 2016-09-20: qty 30

## 2016-09-20 MED ORDER — HEPARIN (PORCINE) IN NACL 2-0.9 UNIT/ML-% IJ SOLN
INTRAMUSCULAR | Status: AC | PRN
  Administered 2016-09-20: 500 mL

## 2016-09-20 MED ORDER — SODIUM CHLORIDE 0.9 % IV SOLN: 250.0000 mL | INTRAVENOUS | Status: DC | PRN

## 2016-09-20 MED ORDER — METOPROLOL TARTRATE 5 MG/5ML IV SOLN
INTRAVENOUS | Status: DC | PRN
  Administered 2016-09-20: 5 mg via INTRAVENOUS

## 2016-09-20 MED ORDER — METOPROLOL SUCCINATE ER 25 MG PO TB24
25.0000 mg | ORAL_TABLET | Freq: Two times a day (BID) | ORAL | Status: DC
  Administered 2016-09-20 – 2016-09-21 (×2): 25 mg via ORAL
  Filled 2016-09-20 (×2): qty 1

## 2016-09-20 MED ORDER — FENTANYL CITRATE (PF) 100 MCG/2ML IJ SOLN
INTRAMUSCULAR | Status: AC
  Filled 2016-09-20: qty 2

## 2016-09-20 MED ORDER — ACETAMINOPHEN 325 MG PO TABS
650.0000 mg | ORAL_TABLET | ORAL | Status: DC | PRN
  Administered 2016-09-20: 650 mg via ORAL
  Filled 2016-09-20: qty 2

## 2016-09-20 MED ORDER — MIDAZOLAM HCL 5 MG/5ML IJ SOLN
INTRAMUSCULAR | Status: DC | PRN
  Administered 2016-09-20: 3 mg via INTRAVENOUS
  Administered 2016-09-20: 2 mg via INTRAVENOUS
  Administered 2016-09-20 (×2): 3 mg via INTRAVENOUS
  Administered 2016-09-20: 1 mg via INTRAVENOUS
  Administered 2016-09-20 (×2): 2 mg via INTRAVENOUS
  Administered 2016-09-20: 3 mg via INTRAVENOUS
  Administered 2016-09-20: 1 mg via INTRAVENOUS
  Administered 2016-09-20: 2 mg via INTRAVENOUS
  Administered 2016-09-20: 3 mg via INTRAVENOUS
  Administered 2016-09-20: 1 mg via INTRAVENOUS
  Administered 2016-09-20: 2 mg via INTRAVENOUS

## 2016-09-20 MED ORDER — SODIUM CHLORIDE 0.9% FLUSH
3.0000 mL | Freq: Two times a day (BID) | INTRAVENOUS | Status: DC
  Administered 2016-09-20: 3 mL via INTRAVENOUS

## 2016-09-20 MED ORDER — FLECAINIDE ACETATE 50 MG PO TABS
75.0000 mg | ORAL_TABLET | Freq: Two times a day (BID) | ORAL | Status: DC
  Administered 2016-09-20: 75 mg via ORAL
  Filled 2016-09-20 (×2): qty 2

## 2016-09-20 MED ORDER — ONDANSETRON HCL 4 MG/2ML IJ SOLN: 4.0000 mg | Freq: Four times a day (QID) | INTRAMUSCULAR | Status: DC | PRN

## 2016-09-20 MED ORDER — BUPIVACAINE HCL (PF) 0.25 % IJ SOLN
INTRAMUSCULAR | Status: DC | PRN
  Administered 2016-09-20: 40 mL

## 2016-09-20 MED ORDER — FENTANYL CITRATE (PF) 100 MCG/2ML IJ SOLN
INTRAMUSCULAR | Status: DC | PRN
  Administered 2016-09-20 (×2): 25 ug via INTRAVENOUS
  Administered 2016-09-20: 12.5 ug via INTRAVENOUS
  Administered 2016-09-20 (×2): 25 ug via INTRAVENOUS
  Administered 2016-09-20: 12.5 ug via INTRAVENOUS
  Administered 2016-09-20 (×3): 25 ug via INTRAVENOUS

## 2016-09-20 MED ORDER — APIXABAN 5 MG PO TABS
5.0000 mg | ORAL_TABLET | Freq: Two times a day (BID) | ORAL | Status: DC
Start: 2016-09-20 — End: 2016-09-21
  Administered 2016-09-20 – 2016-09-21 (×2): 5 mg via ORAL
  Filled 2016-09-20 (×2): qty 1

## 2016-09-20 MED ORDER — METOPROLOL TARTRATE 5 MG/5ML IV SOLN
INTRAVENOUS | Status: AC
  Filled 2016-09-20: qty 5

## 2016-09-20 MED ORDER — HEPARIN (PORCINE) IN NACL 2-0.9 UNIT/ML-% IJ SOLN
INTRAMUSCULAR | Status: AC
  Filled 2016-09-20: qty 500

## 2016-09-20 SURGICAL SUPPLY — 12 items
BAG SNAP BAND KOVER 36X36 (MISCELLANEOUS) ×2 IMPLANT
CATH BLAZERPRIME XP (ABLATOR) ×2 IMPLANT
CATH EZ STEER NAV 8MM F-J CUR (ABLATOR) ×2 IMPLANT
CATH JOSEPHSON QUAD-ALLRED 6FR (CATHETERS) ×2 IMPLANT
CATH WEBSTER BI DIR CS D-F CRV (CATHETERS) ×2 IMPLANT
HOVERMATT SINGLE USE (MISCELLANEOUS) ×2 IMPLANT
PACK EP LATEX FREE (CUSTOM PROCEDURE TRAY) ×3
PACK EP LF (CUSTOM PROCEDURE TRAY) IMPLANT
PAD DEFIB LIFELINK (PAD) ×2 IMPLANT
PATCH CARTO3 (PAD) ×2 IMPLANT
SHEATH PINNACLE 6F 10CM (SHEATH) ×2 IMPLANT
SHEATH PINNACLE 8F 10CM (SHEATH) ×4 IMPLANT

## 2016-09-20 NOTE — Progress Notes (Signed)
Dr Ladona Ridgelaylor notified of c/o chest pressure and per Dr Ladona Ridgelaylor EKG done and started oxygen via nasal cannula 3l/min

## 2016-09-20 NOTE — Progress Notes (Signed)
Site area: Right groin a 6, and 8 [place] X2 venous sheath removed from right groin by Haywood PaoKenzie Campbell RN  Site Prior to Removal:  Level 0  Pressure Applied For 20 MINUTES    Bedrest Beginning at 17:20  Manual:   Yes.    Patient Status During Pull:  stable  Post Pull Groin Site:  Level 0  Post Pull Instructions Given:  Yes.    Post Pull Pulses Present:  Yes.    Dressing Applied:  Yes.    Comments:  VS remain stable during sheath pull. Patient states she understands post pull instructions. No complications.

## 2016-09-20 NOTE — H&P (View-Only) (Signed)
HPI Shelley Edwards returns today for followup after developing atrial flutter. She is a pleasant 51 yo woman who has a CHADSVASC score of 1 and who has had fairly mild palpitations who was found to have atrial flutter and fibrillation on ECG and cardiac monitoring. Evaluation with a cardiac monitor shows both arrhythmias and she has had a 2 D echo demonstrating normal LV function with mild LA enlargement. She has not had syncope. I recommended she start flecainide as she had both arrhythmias and her atrial fib has resolved but she has developed atrial flutter with a RVR. She does not have palpitations and she does not feel her atrial flutter unless she tries to do something strenuous.   No Known Allergies   Current Outpatient Prescriptions  Medication Sig Dispense Refill  . flecainide (TAMBOCOR) 150 MG tablet Take 0.5 tablets (75 mg total) by mouth 2 (two) times daily. 90 tablet 2  . metoprolol succinate (TOPROL-XL) 25 MG 24 hr tablet Take 1 tablet (25 mg total) by mouth 2 (two) times daily. Take with or immediately following a meal. 60 tablet 3  . apixaban (ELIQUIS) 5 MG TABS tablet Take 1 tablet (5 mg total) by mouth 2 (two) times daily. 60 tablet 3   No current facility-administered medications for this visit.      Past Medical History:  Diagnosis Date  . Abnormal uterine bleeding 07/2015  . Acute meniscal tear, medial    left  . H/O atrial flutter   . Headache(784.0)     ROS:   All systems reviewed and negative except as noted in the HPI.   Past Surgical History:  Procedure Laterality Date  . APPENDECTOMY  at age 89  . BACK SURGERY    . CHOLECYSTECTOMY  ~2002  . KNEE ARTHROSCOPY Left 07/27/2016   Procedure: LEFT KNEE ARTHROSCOPY WITH PARTIAL MEDIAL MENISCECTOMY;  Surgeon: Kathryne HitchBlackman, Christopher Y, MD;  Location: [facility] OR;  Service: Orthopedics;  Laterality: Left;  . KNEE SURGERY       Family History  Problem Relation Age of Onset  . Arthritis Unknown   . Diabetes  Mother   . Melanoma Mother      Social History   Social History  . Marital status: Married    Spouse name: N/A  . Number of children: N/A  . Years of education: N/A   Occupational History  . Not on file.   Social History Main Topics  . Smoking status: Light Tobacco Smoker  . Smokeless tobacco: Never Used  . Alcohol use Yes     Comment: occ  . Drug use: No  . Sexual activity: Yes    Birth control/ protection: Condom   Other Topics Concern  . Not on file   Social History Narrative  . No narrative on file     Pulse (!) 136   Ht 5\' 9"  (1.753 m)   Wt 288 lb (130.6 kg)   SpO2 98%   BMI 42.53 kg/m   Physical Exam:  Well appearing but obese middle age woman, NAD HEENT: Unremarkable Neck:  6 cm JVD, no thyromegally Lymphatics:  No adenopathy Back:  No CVA tenderness Lungs:  Clear with no wheezes HEART:  Regular tachy rhythm, no murmurs, no rubs, no clicks Abd:  soft, obese, positive bowel sounds, no organomegally, no rebound, no guarding Ext:  2 plus pulses, trace edema left leg, no cyanosis, no clubbing Skin:  No rashes no nodules Neuro:  CN II through XII intact, motor  grossly intact  EKG - reviewed  Assess/Plan: 1. Atrial fib - she has clear evidence of this with a RVR and a CVR. I have recommended she start flecainide. Her stroke risk is low. We discussed the various treatment options in detail.  2. Atrial flutter - she has developed incessant atrial flutter. I have recommended she continue the flecainide and undergo EP study and catheter ablation of atrial flutter. I also discussed options of therapy including atrial fib and flutter ablation. For now she would like to proceed with atrial flutter ablation. She will be started on Eliquis today and she will not need a TEE as we plan to perform ablation after 3 weeks of anti-coagulation. 3. Obesity - we discussed how her weight gain may be playing a role in her pre-disposition to having her heart go out of rhythm. I  have asked her to try and lose weight. 4. Dyspnea - this is fairly mild. I have asked her to redu ce her salt intake, lose weight and hopefully her symptoms will improve. She is still working.   Shelley Edwards,M.D.

## 2016-09-20 NOTE — Interval H&P Note (Signed)
History and Physical Interval Note:  09/20/2016 1:45 PM  Shelley Edwards  has presented today for surgery, with the diagnosis of flutter  The various methods of treatment have been discussed with the patient and family. After consideration of risks, benefits and other options for treatment, the patient has consented to  Procedure(s): A-Flutter Ablation (N/A) as a surgical intervention .  The patient's history has been reviewed, patient examined, no change in status, stable for surgery.  I have reviewed the patient's chart and labs.  Questions were answered to the patient's satisfaction.     Lewayne BuntingGregg Kaydence Menard

## 2016-09-21 ENCOUNTER — Encounter (HOSPITAL_COMMUNITY): Payer: Self-pay | Admitting: Internal Medicine

## 2016-09-21 DIAGNOSIS — I483 Typical atrial flutter: Secondary | ICD-10-CM

## 2016-09-21 NOTE — Progress Notes (Signed)
Patient received all discharge information and education. The only available medications this morning to give her were her metoprolol and eliquis. I advised her to take her tambocor when she got home. She verbalized her understanding.

## 2016-09-21 NOTE — Discharge Instructions (Signed)
No driving for 4 days. No lifting over 5 lbs for 1 week. No vigorous or sexual activity for 1 week. You may return to work on 09/27/16. Keep procedure site clean & dry. If you notice increased pain, swelling, bleeding or pus, call/return!  You may shower, but no soaking baths/hot tubs/pools for 1 week.

## 2016-09-21 NOTE — Plan of Care (Signed)
Problem: Activity: Goal: Ability to return to baseline activity level will improve Outcome: Progressing Pt compliant with bedrest, then ambulated in room. At her baseline with  functional mobility.  Problem: Cardiac: Goal: Ability to maintain adequate cardiovascular perfusion will improve Outcome: Progressing Remains in SR, Vital signs stable. R pedal pulse palpable, + 2 Goal: Vascular access site(s) Level 0-1 will be maintained Outcome: Progressing R groin level 0; No bleeding or hematoma noted.

## 2016-09-21 NOTE — Discharge Summary (Signed)
ELECTROPHYSIOLOGY PROCEDURE DISCHARGE SUMMARY    Patient ID: Shelley Edwards,  MRN: [id number], DOB/AGE: 05-Nov-1965 51 y.o.  Admit date: 09/20/2016 Discharge date: 09/21/2016  Primary Care Physician: Wanda Plump, MD  Primary Cardiologist/Electrophysiologist: Dr. Ladona Ridgel  Primary Discharge Diagnosis:  1. Atrial flutter status post ablation this admission 2. Paroxysmal Afib     CHA2DS2Vasc is one, on Eliquis  Secondary Discharge Diagnosis:  none  No Known Allergies   Procedures This Admission: 1.  Electrophysiology study and radiofrequency catheter ablation on 09/20/16 by Dr Ladona Ridgel.   CONCLUSIONS:  1. Inducible isthmus dependent atrial flutter and paroxysmal atrial fibrillation.  2. Successful radiofrequency ablation of atrial flutter along the cavotricuspid isthmus with complete bidirectional isthmus block achieved.  3. Spontaneous atrial fib requiring DCCV  4. No early apparent complications.   Brief HPI: Shelley Edwards is a 51 y.o. female with a past medical history as outlined above.  She has documented atrial flutter. And has been appropriately anticoagulated for >4 weeks.  Risks, benefits, and alternatives to ablation were reviewed with the patient who wished to proceed.   Hospital Course:  The patient was admitted and underwent EPS/RFCA with details as outlined above. She was monitored on telemetry overnight which demonstrated SB/SR, 50's-60's.  Right groin procedure site is without complication.  The patient was examined by Dr Rodman Key who considered her stable for discharge to home.  Follow up will be arranged in 4 weeks.  Wound care and restrictions were reviewed with the patient prior to discharge.   Physical Exam: Vitals:   09/20/16 1805 09/20/16 1820 09/20/16 1835 09/20/16 1905  BP: 113/69 112/80 127/74 126/66  Pulse: (!) 42 66 73 64  Resp: 19 18 19 14   Temp:      TempSrc:      SpO2: 97% 96% 98% 97%  Weight:      Height:        GEN- The patient is well  appearing, alert and oriented x 3 today.   HEENT: normocephalic, atraumatic; sclera clear, conjunctiva pink; hearing intact; oropharynx clear; neck supple, no JVP Lymph- no cervical lymphadenopathy Lungs- CTA b/l, normal work of breathing.  No wheezes, rales, rhonchi Heart- Regular rate and rhythm, no murmurs, rubs or gallops, PMI not laterally displaced GI- soft, non-tender, non-distended Extremities- no clubbing, cyanosis, or edema; DP/PT/radial pulses 2+ bilaterally, right groin without hematoma/bruit MS- no significant deformity or atrophy Skin- warm and dry, no rash or lesion Psych- euthymic mood, full affect Neuro- strength and sensation are intact   Labs:   Lab Results  Component Value Date   WBC 6.5 09/18/2016   HGB 12.8 09/18/2016   HCT 38.2 09/18/2016   MCV 92 09/18/2016   PLT 305 09/20/2016     Recent Labs Lab 09/18/16 0922  NA 139  K 4.7  CL 101  CO2 24  BUN 11  CREATININE 0.68  CALCIUM 8.9  GLUCOSE 103*    Discharge Medications:  Allergies as of 09/21/2016   No Known Allergies     Medication List    TAKE these medications   apixaban 5 MG Tabs tablet Commonly known as:  ELIQUIS Take 1 tablet (5 mg total) by mouth 2 (two) times daily.   flecainide 150 MG tablet Commonly known as:  TAMBOCOR Take 0.5 tablets (75 mg total) by mouth 2 (two) times daily.   metoprolol succinate 50 MG 24 hr tablet Commonly known as:  TOPROL-XL Take 25 mg by mouth 2 (two) times daily.  Disposition:  Discharge Instructions    Diet - low sodium heart healthy    Complete by:  As directed    Increase activity slowly    Complete by:  As directed      Follow-up Information    Marinus Mawaylor, Gregg W, MD Follow up on 10/24/2016.   Specialty:  Cardiology Why:  1:45PM Contact information: [address] [place] [address] [phone]           Duration of Discharge Encounter: Greater than 30 minutes including physician time.  Norma FredricksonSigned, Renee  Ursuy, PA-C 09/21/2016 6:58 AM  EP Attending  Patient seen and examined. Agree with above. The patient is stable after ablation. She will continue her current meds for now with plans to stop her beta blocker if she is maintaining NSR when she returns for followup.   Leonia ReevesGregg Taylor,M.D.

## 2016-09-25 NOTE — Telephone Encounter (Signed)
Can you call her and see if she wants to get this injection before her next scheduled appointment? If she would like to come then, that is fine.

## 2016-09-26 NOTE — Telephone Encounter (Signed)
She had the Synvisc last appointment, she states she is just having so much pain in knee and calf Can we recommend something for the cyst?

## 2016-09-26 NOTE — Telephone Encounter (Signed)
Called patient she advised she got the injection at her last visit 09/07/16. Patient  said she is still hurting in her left calf and her left knee is hurting on the inside as well. Patient is concerned about the pain she is having and also concerned about the bakers cyst. Patient asked if Dr Magnus IvanBlackman have any advise for her. Patient said the cyst has not gone down at all.  The number to contact patient is [phone]

## 2016-09-26 NOTE — Telephone Encounter (Signed)
There is unfortunately really nothing else that I can do for her knee. 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 Sometimes arthroscopic surgery does decompress the Baker's cyst other times it does not but there is not open surgery do for cysts in the back in the knee. I think this shows how significant arthritic changes in her knee and then. No other thing we can try is at least ordering some hyaluronic acid for her knee to see if we can order that for her and get her back to place that type of injection in her knee.

## 2016-09-26 NOTE — Telephone Encounter (Signed)
Nothing we can do except another steroid injection and I can try to inject the cyst as well

## 2016-09-27 NOTE — Telephone Encounter (Signed)
Called patient concerning scheduling appointment for cortisone injection. Patient said she will keep appointment 11/13/16 for injection. [phone]

## 2016-09-27 NOTE — Telephone Encounter (Signed)
Would you mind calling her back and making an appt for just a cortisone injection Since he said at this point that's all we could do, maybe even inject the cyst?

## 2016-10-24 ENCOUNTER — Encounter: Payer: Self-pay | Admitting: Internal Medicine

## 2016-10-24 ENCOUNTER — Ambulatory Visit (INDEPENDENT_AMBULATORY_CARE_PROVIDER_SITE_OTHER): Payer: BLUE CROSS/BLUE SHIELD | Admitting: Internal Medicine

## 2016-10-24 VITALS — BP 104/68 | HR 73 | Ht 69.0 in | Wt 299.4 lb

## 2016-10-24 DIAGNOSIS — I4892 Unspecified atrial flutter: Secondary | ICD-10-CM | POA: Diagnosis not present

## 2016-10-24 NOTE — Progress Notes (Signed)
HPI Shelley Edwards returns today for follow-up. She is a very pleasant 51 year old morbidly obese woman with paroxysmal atrial fibrillation, for which she was treated with beta blocker and flecainide therapy, and then developed atrial flutter with a rapid ventricular response. She underwent catheter ablation of her atrial flutter and immediately postoperatively had a brief episode of atrial fibrillation which has resolved. She remains on low-dose flecainide. She is also on low-dose beta blocker therapy along with systemic anticoagulation. She has been unable to lose weight. She did have atrial fibrillation during her catheter ablation. She denies peripheral edema. She remains fairly sedentary and is frustrated by her inability to lose weight. No Known Allergies   Current Outpatient Prescriptions  Medication Sig Dispense Refill  . apixaban (ELIQUIS) 5 MG TABS tablet Take 1 tablet (5 mg total) by mouth 2 (two) times daily. 60 tablet 3  . flecainide (TAMBOCOR) 150 MG tablet Take 0.5 tablets (75 mg total) by mouth 2 (two) times daily. 90 tablet 2  . metoprolol succinate (TOPROL-XL) 25 MG 24 hr tablet Take 25 mg by mouth 2 (two) times daily.     No current facility-administered medications for this visit.      Past Medical History:  Diagnosis Date  . Abnormal uterine bleeding 07/2015  . Acute meniscal tear, medial    left  . H/O atrial flutter   . Headache(784.0)     ROS:   All systems reviewed and negative except as noted in the HPI.   Past Surgical History:  Procedure Laterality Date  . A-FLUTTER ABLATION N/A 09/20/2016   Procedure: A-Flutter Ablation;  Surgeon: Marinus Maw, MD;  Location: [facility];  Service: Cardiovascular;  Laterality: N/A;  . APPENDECTOMY  at age 59  . BACK SURGERY    . CHOLECYSTECTOMY  ~2002  . KNEE ARTHROSCOPY Left 07/27/2016   Procedure: LEFT KNEE ARTHROSCOPY WITH PARTIAL MEDIAL MENISCECTOMY;  Surgeon: Kathryne Hitch, MD;  Location:  [facility] OR;  Service: Orthopedics;  Laterality: Left;  . KNEE SURGERY       Family History  Problem Relation Age of Onset  . Arthritis Unknown   . Diabetes Mother   . Melanoma Mother      Social History   Social History  . Marital status: Married    Spouse name: N/A  . Number of children: N/A  . Years of education: N/A   Occupational History  . Not on file.   Social History Main Topics  . Smoking status: Light Tobacco Smoker  . Smokeless tobacco: Never Used  . Alcohol use Yes     Comment: occ  . Drug use: No  . Sexual activity: Yes    Birth control/ protection: Condom   Other Topics Concern  . Not on file   Social History Narrative  . No narrative on file     BP 104/68   Pulse 73   Ht 5\' 9"  (1.753 m)   Wt 299 lb 6.4 oz (135.8 kg)   LMP  (LMP Unknown) Comment: irregular menses  BMI 44.21 kg/m   Physical Exam:  Well appearing obese middle-aged woman, NAD HEENT: Unremarkable Neck:  7 cm JVD, no thyromegally Lymphatics:  No adenopathy Back:  No CVA tenderness Lungs:  Clear bilaterally with no wheezes, rales, or rhonchi. HEART:  Regular rate rhythm, no murmurs, no rubs, no clicks Abd:  soft, positive bowel sounds, no organomegally, no rebound, no guarding Ext:  2 plus pulses, no edema, no cyanosis,  no clubbing Skin:  No rashes no nodules Neuro:  CN II through XII intact, motor grossly intact  EKG - normal sinus rhythm   Assess/Plan: 1. Atrial flutter - she is doing well after catheter ablation with no recurrent arrhythmias 2. Atrial fibrillation - she is maintaining sinus rhythm on low-dose flecainide. I've asked the patient to increase her dose to 3, 75 mg tablets if needed for recurrent palpitations over 24 hours. 3. Obesity - I discussed the importance of weight loss as it pertains to her atrial arrhythmias. She is frustrated by her inability to lose weight. I recommended a 1500-calorie daily diet.  Lewayne Bunting, M.D.

## 2016-10-24 NOTE — Patient Instructions (Addendum)
Medication Instructions:  Your physician recommends that you continue on your current medications as directed. Please refer to the Current Medication list given to you today.  Labwork: None ordered.  Testing/Procedures: None ordered.  Follow-Up: Your physician wants you to follow-up in: 4 months with Dr. Taylor. You will receive a reminder letter in the mail two months in advance. If you don't receive a letter, please call our office to schedule the follow-up appointment.   Any Other Special Instructions Will Be Listed Below (If Applicable).  If you need a refill on your cardiac medications before your next appointment, please call your pharmacy.   

## 2016-11-13 ENCOUNTER — Ambulatory Visit (INDEPENDENT_AMBULATORY_CARE_PROVIDER_SITE_OTHER): Payer: BLUE CROSS/BLUE SHIELD | Admitting: Orthopaedic Surgery

## 2016-11-22 ENCOUNTER — Ambulatory Visit (INDEPENDENT_AMBULATORY_CARE_PROVIDER_SITE_OTHER): Payer: BLUE CROSS/BLUE SHIELD | Admitting: Orthopaedic Surgery

## 2016-12-29 ENCOUNTER — Other Ambulatory Visit: Payer: Self-pay | Admitting: Internal Medicine

## 2017-01-30 ENCOUNTER — Other Ambulatory Visit: Payer: Self-pay | Admitting: Internal Medicine

## 2017-02-06 ENCOUNTER — Encounter: Payer: Self-pay | Admitting: Internal Medicine

## 2017-02-06 ENCOUNTER — Ambulatory Visit: Payer: BLUE CROSS/BLUE SHIELD | Admitting: Internal Medicine

## 2017-02-06 VITALS — BP 122/90 | HR 100 | Ht 69.0 in | Wt 297.6 lb

## 2017-02-06 DIAGNOSIS — Z79899 Other long term (current) drug therapy: Secondary | ICD-10-CM | POA: Diagnosis not present

## 2017-02-06 DIAGNOSIS — I4892 Unspecified atrial flutter: Secondary | ICD-10-CM | POA: Diagnosis not present

## 2017-02-06 NOTE — Patient Instructions (Addendum)
Medication Instructions:  Your physician recommends that you continue on your current medications as directed. Please refer to the Current Medication list given to you today.  Labwork: None ordered.  Testing/Procedures: Your physician has recommended that you wear a holter monitor. Holter monitors are medical devices that record the heart's electrical activity. Doctors most often use these monitors to diagnose arrhythmias. Arrhythmias are problems with the speed or rhythm of the heartbeat. The monitor is a small, portable device. You can wear one while you do your normal daily activities. This is usually used to diagnose what is causing palpitations/syncope (passing out).  Please schedule for a 48 hour holter monitor.  Follow-Up: Your physician wants you to follow-up in: 2 to 3 weeks with Dr. Ladona Ridgelaylor.  Any Other Special Instructions Will Be Listed Below (If Applicable).  If you need a refill on your cardiac medications before your next appointment, please call your pharmacy.

## 2017-02-06 NOTE — Progress Notes (Signed)
HPI Mrs. Pilley returns today for ongoing evaluation of multiple atrial arrhythmias.  She is a very pleasant 51 year old obese woman with hypertension who developed atrial flutter as well as atrial fibrillation.  She was initially placed on flecainide, and then had persistent typical atrial flutter.  She underwent EP study and catheter ablation approximately 4 months ago.  During her procedure, atrial fibrillation developed making the procedure more difficult.  In addition, manipulation of the ablation catheter was difficult because we could not get all the way to the atrial flutter isthmus and had to use a second longer reach catheter.  During the procedure she was cardioverted back to sinus rhythm and isthmus block was demonstrated.  In the interim, she has felt some palpitations.  At times she is awakened from the sensation of heart racing. No Known Allergies   Current Outpatient Medications  Medication Sig Dispense Refill  . ELIQUIS 5 MG TABS tablet TAKE 1 TABLET(5 MG) BY MOUTH TWICE DAILY 60 tablet 5  . flecainide (TAMBOCOR) 150 MG tablet Take 0.5 tablets (75 mg total) by mouth 2 (two) times daily. 90 tablet 2  . metoprolol succinate (TOPROL-XL) 25 MG 24 hr tablet TAKE 1 TABLET BY MOUTH TWICE DAILY WITH OR IMMEDIATELY FOLLOWING A MEAL 60 tablet 8   No current facility-administered medications for this visit.      Past Medical History:  Diagnosis Date  . Abnormal uterine bleeding 07/2015  . Acute meniscal tear, medial    left  . H/O atrial flutter   . Headache(784.0)     ROS:   All systems reviewed and negative except as noted in the HPI.   Past Surgical History:  Procedure Laterality Date  . A-FLUTTER ABLATION N/A 09/20/2016   Procedure: A-Flutter Ablation;  Surgeon: Marinus Mawaylor, Gregg W, MD;  Location: [facility];  Service: Cardiovascular;  Laterality: N/A;  . APPENDECTOMY  at age 51  . BACK SURGERY    . CHOLECYSTECTOMY  ~2002  . KNEE ARTHROSCOPY Left 07/27/2016     Procedure: LEFT KNEE ARTHROSCOPY WITH PARTIAL MEDIAL MENISCECTOMY;  Surgeon: Kathryne HitchBlackman, Christopher Y, MD;  Location: [facility];  Service: Orthopedics;  Laterality: Left;  . KNEE SURGERY       Family History  Problem Relation Age of Onset  . Arthritis Unknown   . Diabetes Mother   . Melanoma Mother      Social History   Socioeconomic History  . Marital status: Married    Spouse name: Not on file  . Number of children: Not on file  . Years of education: Not on file  . Highest education level: Not on file  Social Needs  . Financial resource strain: Not on file  . Food insecurity - worry: Not on file  . Food insecurity - inability: Not on file  . Transportation needs - medical: Not on file  . Transportation needs - non-medical: Not on file  Occupational History  . Not on file  Tobacco Use  . Smoking status: Light Tobacco Smoker  . Smokeless tobacco: Never Used  Substance and Sexual Activity  . Alcohol use: Yes    Comment: occ  . Drug use: No  . Sexual activity: Yes    Birth control/protection: Condom  Other Topics Concern  . Not on file  Social History Narrative  . Not on file     BP 122/90   Pulse 100   Ht 5\' 9"  (1.753 m)   Wt 297 lb 9.6 oz (135  kg)   LMP  (LMP Unknown) Comment: irregular menses  SpO2 97%   BMI 43.95 kg/m   Physical Exam:  Well appearing 51 year old woman, NAD HEENT: Unremarkable Neck: 6 cm JVD, no thyromegally Lymphatics:  No adenopathy Back:  No CVA tenderness Lungs:  Clear, with no wheezes, rales, or rhonchi HEART:  Regular rate rhythm, no murmurs, no rubs, no clicks Abd:  soft, positive bowel sounds, no organomegally, no rebound, no guarding Ext:  2 plus pulses, no edema, no cyanosis, no clubbing Skin:  No rashes no nodules Neuro:  CN II through XII intact, motor grossly intact  EKG -atypical atrial flutter with 2-1 and 3-1 AV conduction  Assess/Plan: 1.  Atrial fibrillation and flutter -she has had breakthrough arrhythmias.  I  have asked the patient to wear a 48-hour heart monitor to see whether or not it is atypical flutter or atrial fibrillation or a combination of the above.  She will continue her antiarrhythmic drug therapy at this time. 2.  Obesity -her weight has been up and down.  She is encouraged to lose weight.  Sharrell KuGreg Taylor, MD

## 2017-02-09 ENCOUNTER — Encounter: Payer: Self-pay | Admitting: Internal Medicine

## 2017-02-16 ENCOUNTER — Ambulatory Visit (INDEPENDENT_AMBULATORY_CARE_PROVIDER_SITE_OTHER): Payer: BLUE CROSS/BLUE SHIELD

## 2017-02-16 DIAGNOSIS — Z79899 Other long term (current) drug therapy: Secondary | ICD-10-CM | POA: Diagnosis not present

## 2017-02-16 DIAGNOSIS — I4892 Unspecified atrial flutter: Secondary | ICD-10-CM | POA: Diagnosis not present

## 2017-02-28 ENCOUNTER — Ambulatory Visit (INDEPENDENT_AMBULATORY_CARE_PROVIDER_SITE_OTHER): Payer: BLUE CROSS/BLUE SHIELD | Admitting: Internal Medicine

## 2017-02-28 ENCOUNTER — Encounter: Payer: Self-pay | Admitting: Internal Medicine

## 2017-02-28 VITALS — BP 120/108 | HR 96 | Ht 69.0 in | Wt 297.0 lb

## 2017-02-28 DIAGNOSIS — I4892 Unspecified atrial flutter: Secondary | ICD-10-CM | POA: Diagnosis not present

## 2017-02-28 DIAGNOSIS — R002 Palpitations: Secondary | ICD-10-CM | POA: Diagnosis not present

## 2017-02-28 MED ORDER — METOPROLOL SUCCINATE ER 25 MG PO TB24: 37.5000 mg | ORAL_TABLET | Freq: Two times a day (BID) | ORAL | 3 refills | Status: DC

## 2017-02-28 NOTE — Progress Notes (Signed)
HPI Mrs. Durfey returns today for ongoing evaluation and management of her atrial arrhythmias. She is an obese middle aged woman with atrial flutter and fib who underwent ablation several months ago. She has had recurrent atrial arrhythmias including atypical atrial flutter and atrial fib while on AA drug therapy with flecainide. She is able to get along and has minimal palpitations. She c/o weakness in her thighs bilaterally. No injury.  No Known Allergies   Current Outpatient Medications  Medication Sig Dispense Refill  . ELIQUIS 5 MG TABS tablet TAKE 1 TABLET(5 MG) BY MOUTH TWICE DAILY 60 tablet 5  . flecainide (TAMBOCOR) 150 MG tablet Take 0.5 tablets (75 mg total) by mouth 2 (two) times daily. 90 tablet 2  . metoprolol succinate (TOPROL-XL) 25 MG 24 hr tablet TAKE 1 TABLET BY MOUTH TWICE DAILY WITH OR IMMEDIATELY FOLLOWING A MEAL 60 tablet 8   No current facility-administered medications for this visit.      Past Medical History:  Diagnosis Date  . Abnormal uterine bleeding 07/2015  . Acute meniscal tear, medial    left  . H/O atrial flutter   . Headache(784.0)     ROS:   All systems reviewed and negative except as noted in the HPI.   Past Surgical History:  Procedure Laterality Date  . A-FLUTTER ABLATION N/A 09/20/2016   Procedure: A-Flutter Ablation;  Surgeon: Marinus Mawaylor, Gregg W, MD;  Location: [facility];  Service: Cardiovascular;  Laterality: N/A;  . APPENDECTOMY  at age 51  . BACK SURGERY    . CHOLECYSTECTOMY  ~2002  . KNEE ARTHROSCOPY Left 07/27/2016   Procedure: LEFT KNEE ARTHROSCOPY WITH PARTIAL MEDIAL MENISCECTOMY;  Surgeon: Kathryne HitchBlackman, Christopher Y, MD;  Location: [facility];  Service: Orthopedics;  Laterality: Left;  . KNEE SURGERY       Family History  Problem Relation Age of Onset  . Arthritis Unknown   . Diabetes Mother   . Melanoma Mother      Social History   Socioeconomic History  . Marital status: Married    Spouse name: Not on file    . Number of children: Not on file  . Years of education: Not on file  . Highest education level: Not on file  Social Needs  . Financial resource strain: Not on file  . Food insecurity - worry: Not on file  . Food insecurity - inability: Not on file  . Transportation needs - medical: Not on file  . Transportation needs - non-medical: Not on file  Occupational History  . Not on file  Tobacco Use  . Smoking status: Light Tobacco Smoker  . Smokeless tobacco: Never Used  Substance and Sexual Activity  . Alcohol use: Yes    Comment: occ  . Drug use: No  . Sexual activity: Yes    Birth control/protection: Condom  Other Topics Concern  . Not on file  Social History Narrative  . Not on file     BP (!) 120/108   Pulse 96   Ht 5\' 9"  (1.753 m)   Wt 297 lb (134.7 kg)   LMP  (LMP Unknown) Comment: irregular menses  SpO2 99%   BMI 43.86 kg/m   Physical Exam:  Well appearing obese middle age woman, NAD HEENT: Unremarkable Neck:  6 cm JVD, no thyromegally Lymphatics:  No adenopathy Back:  No CVA tenderness Lungs:  Clear HEART:  Regular rate rhythm, no murmurs, no rubs, no clicks Abd:  soft, positive bowel sounds,  no organomegally, no rebound, no guarding Ext:  2 plus pulses, no edema, no cyanosis, no clubbing Skin:  No rashes no nodules Neuro:  CN II through XII intact, motor grossly intact  EKG - atypical atrial flutter with a controlled VR   Assess/Plan: 1. Atypical flutter - we discussed uptitration of her rate control, vs repeat ablation. As she has also had atrial fib I would anticipate she would need PVI. For now we will uptitrate her beta blocker. 2. Atrial fib - she has had minimal amounts on flecainide.  3. Muscle weakness - this could be due to the flecainide. I considered switching to rhythmol. Will hold off on this for now. 4. Obesity - she is encouraged to lose weight.   Leonia ReevesGregg Taylor,M.D.

## 2017-02-28 NOTE — Patient Instructions (Addendum)
Medication Instructions:  Your physician has recommended you make the following change in your medication:  1.  Increase your Toprol XL 25 mg to 1.5 tablets by mouth twice a day with meals.  Labwork: None ordered.  Testing/Procedures: None ordered.  Follow-Up: Your physician wants you to follow-up in: mid February with Dr. Ladona Ridgelaylor.  Any Other Special Instructions Will Be Listed Below (If Applicable).  If you need a refill on your cardiac medications before your next appointment, please call your pharmacy.

## 2017-04-18 ENCOUNTER — Ambulatory Visit: Payer: BLUE CROSS/BLUE SHIELD | Admitting: Internal Medicine

## 2017-04-18 ENCOUNTER — Encounter: Payer: Self-pay | Admitting: Internal Medicine

## 2017-04-18 VITALS — BP 120/78 | HR 118 | Ht 69.0 in | Wt 304.0 lb

## 2017-04-18 DIAGNOSIS — I4892 Unspecified atrial flutter: Secondary | ICD-10-CM | POA: Diagnosis not present

## 2017-04-18 DIAGNOSIS — R002 Palpitations: Secondary | ICD-10-CM | POA: Diagnosis not present

## 2017-04-18 DIAGNOSIS — Z79899 Other long term (current) drug therapy: Secondary | ICD-10-CM

## 2017-04-18 NOTE — Progress Notes (Signed)
HPI Shelley Edwards returns today for followup of atrial fib and flutter. She is a pleasant 52 yo woman with the above problems, s/p ablation of atrial flutter and continuation of flecainide. She developed recurrent atrial arrhythmias mostly atypical flutter although we cannot rule out typical flutter. She has had a gradual uptitration of her medications. She initially felt well but now is much better. She denies chest pain, sob, or edema but she admits to being fairly sedentary. No syncope. She is frustrated by her inability to lose weight. When she wore her heart monitor her ave HR was 100.  No Known Allergies   Current Outpatient Medications  Medication Sig Dispense Refill  . ELIQUIS 5 MG TABS tablet TAKE 1 TABLET(5 MG) BY MOUTH TWICE DAILY 60 tablet 5  . flecainide (TAMBOCOR) 150 MG tablet Take 0.5 tablets (75 mg total) by mouth 2 (two) times daily. 90 tablet 2  . metoprolol succinate (TOPROL XL) 25 MG 24 hr tablet Take 1.5 tablets (37.5 mg total) by mouth 2 (two) times daily with a meal. 270 tablet 3   No current facility-administered medications for this visit.      Past Medical History:  Diagnosis Date  . Abnormal uterine bleeding 07/2015  . Acute meniscal tear, medial    left  . H/O atrial flutter   . Headache(784.0)     ROS:   All systems reviewed and negative except as noted in the HPI.   Past Surgical History:  Procedure Laterality Date  . A-FLUTTER ABLATION N/A 09/20/2016   Procedure: A-Flutter Ablation;  Surgeon: Marinus Maw, MD;  Location: [facility];  Service: Cardiovascular;  Laterality: N/A;  . APPENDECTOMY  at age 8  . BACK SURGERY    . CHOLECYSTECTOMY  ~2002  . KNEE ARTHROSCOPY Left 07/27/2016   Procedure: LEFT KNEE ARTHROSCOPY WITH PARTIAL MEDIAL MENISCECTOMY;  Surgeon: Kathryne Hitch, MD;  Location: [facility];  Service: Orthopedics;  Laterality: Left;  . KNEE SURGERY       Family History  Problem Relation Age of Onset  . Arthritis  Unknown   . Diabetes Mother   . Melanoma Mother      Social History   Socioeconomic History  . Marital status: Married    Spouse name: Not on file  . Number of children: Not on file  . Years of education: Not on file  . Highest education level: Not on file  Social Needs  . Financial resource strain: Not on file  . Food insecurity - worry: Not on file  . Food insecurity - inability: Not on file  . Transportation needs - medical: Not on file  . Transportation needs - non-medical: Not on file  Occupational History  . Not on file  Tobacco Use  . Smoking status: Light Tobacco Smoker  . Smokeless tobacco: Never Used  Substance and Sexual Activity  . Alcohol use: Yes    Comment: occ  . Drug use: No  . Sexual activity: Yes    Birth control/protection: Condom  Other Topics Concern  . Not on file  Social History Narrative  . Not on file     BP 120/78   Pulse (!) 118   Ht 5\' 9"  (1.753 m)   Wt (!) 304 lb (137.9 kg)   LMP  (LMP Unknown) Comment: irregular menses  SpO2 97%   BMI 44.89 kg/m   Physical Exam:  Well appearing 52 yo overweight woman, NAD HEENT: Unremarkable Neck:  6  cm JVD, no thyromegally Lymphatics:  No adenopathy Back:  No CVA tenderness Lungs:  Clear with no wheezes HEART:  Regular rate rhythm, no murmurs, no rubs, no clicks Abd:  soft, positive bowel sounds, no organomegally, no rebound, no guarding Ext:  2 plus pulses, no edema, no cyanosis, no clubbing Skin:  No rashes no nodules Neuro:  CN II through XII intact, motor grossly intact  EKG - none today   Assess/Plan: 1. Atrial flutter - she is s/p ablation. She has had recurrent arrhythmias but denies any sysmptoms. I cannot tell for sure about whether she is having atypical vs typical flutter.  2. Atrial fib - this appears to be fairly well controlled on flecainide.  3. Obesity - we discussed the importance of weight loss. She states that now that she is feeling better, she hopes to be able to  increase her physical activity.  Shelley Edwards,M.D.

## 2017-04-18 NOTE — Patient Instructions (Addendum)

## 2017-04-29 IMAGING — US US EXTREM LOW VENOUS*L*
1 series · 13 of 24 positions shown · non-contrast
Comparison: None.

CLINICAL DATA: 50-year-old female with left calf swelling after
plane flight.



[Series 1: us extrem low venous*left* · 0.09mm/px · 40 acquisitions, 13 frames shown]
[im 1/40]
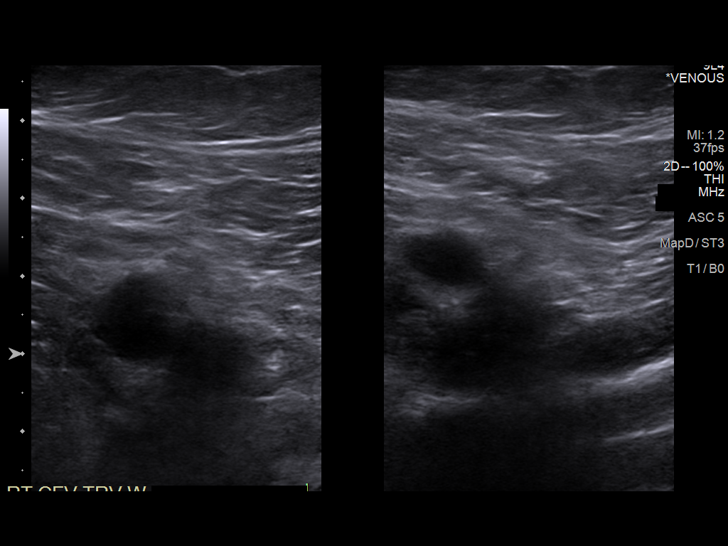
[im 4/40]
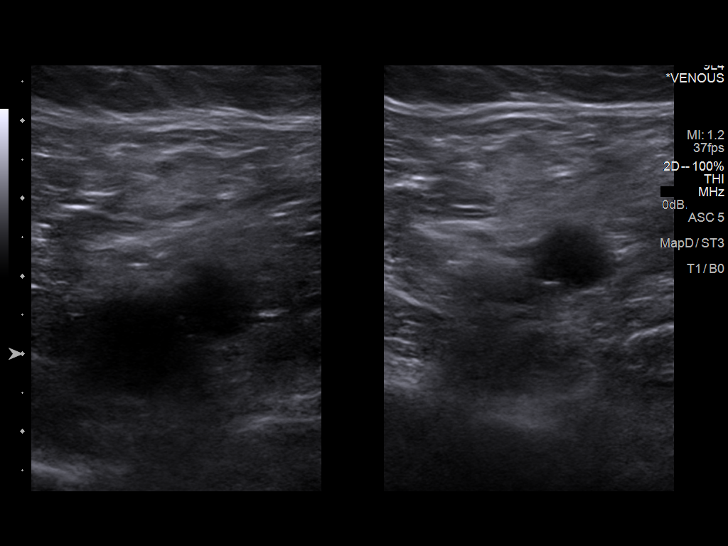
[im 7/40]
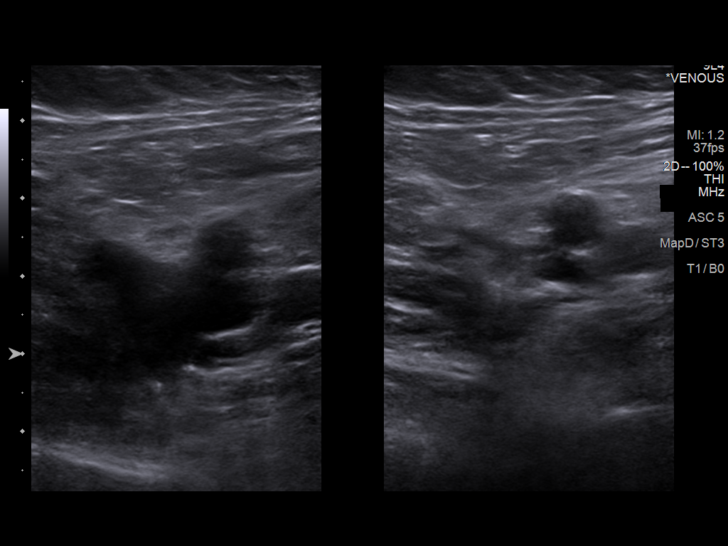
[im 11/40]
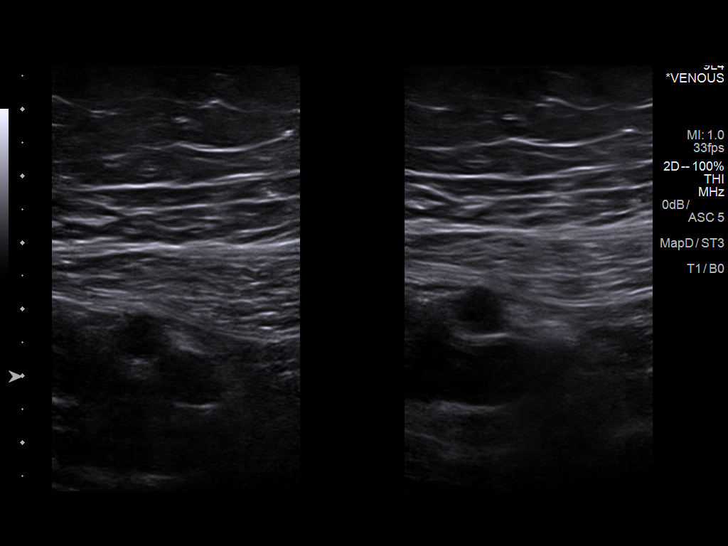
[im 14/40]
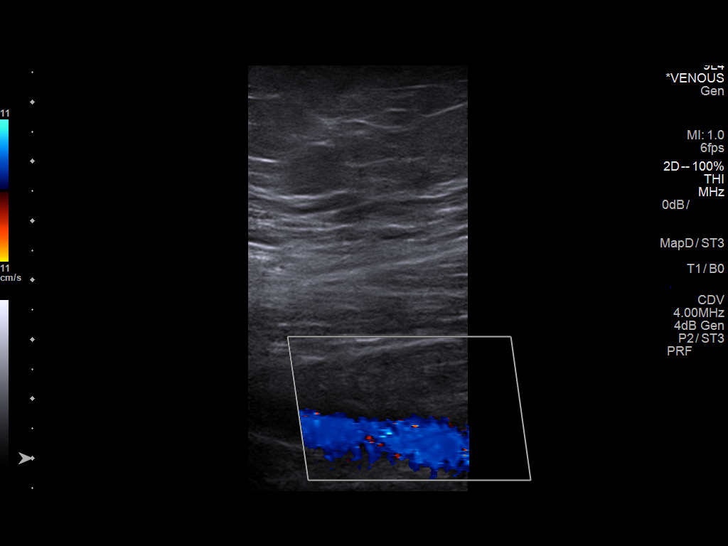
[im 17/40]
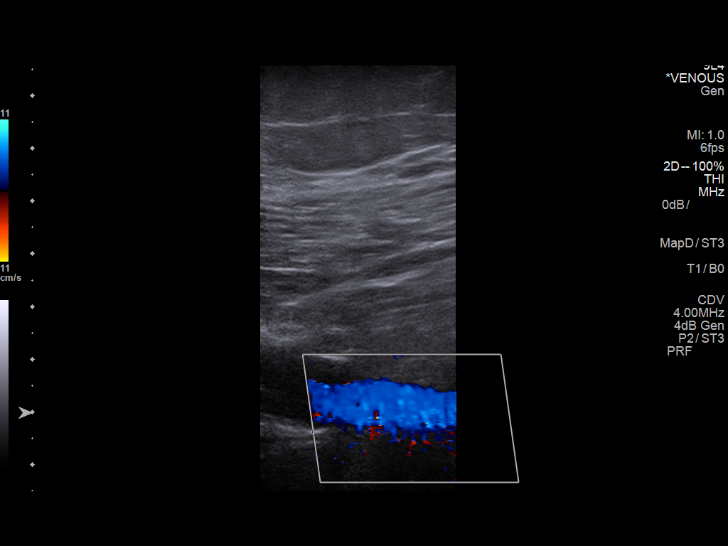
[im 23/40]
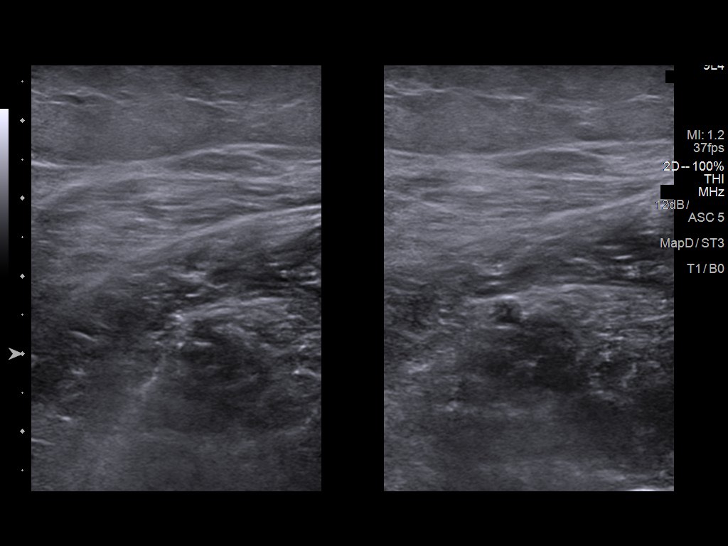
[im 24/40]
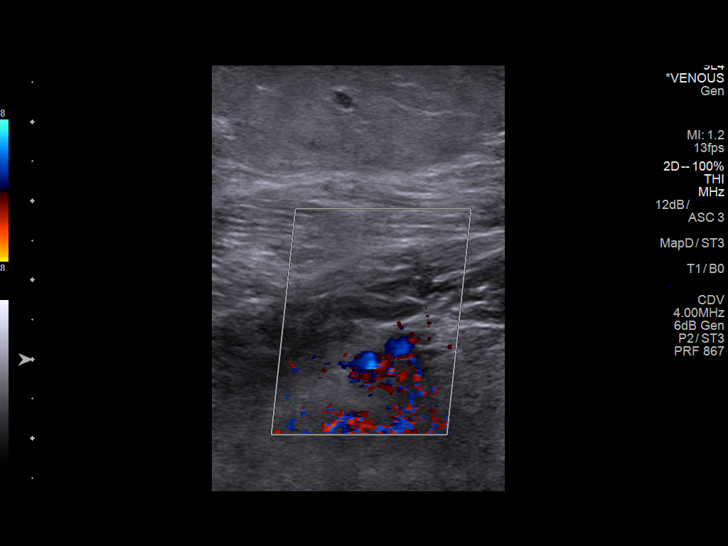
[im 28/40]
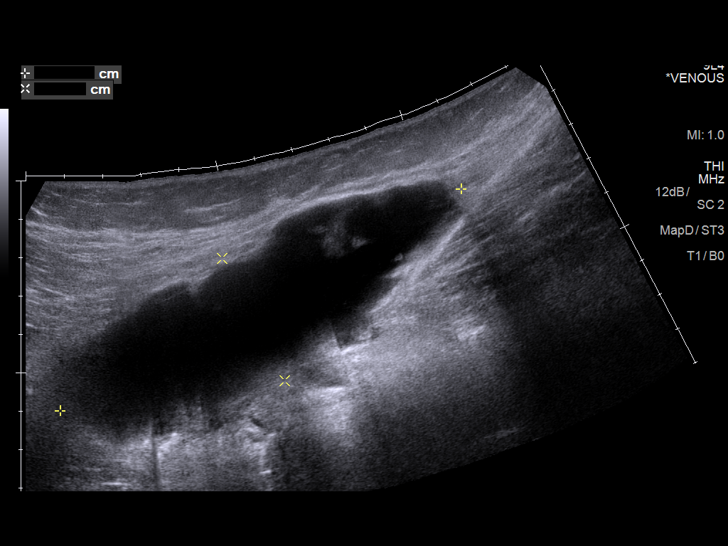
[im 31/40]
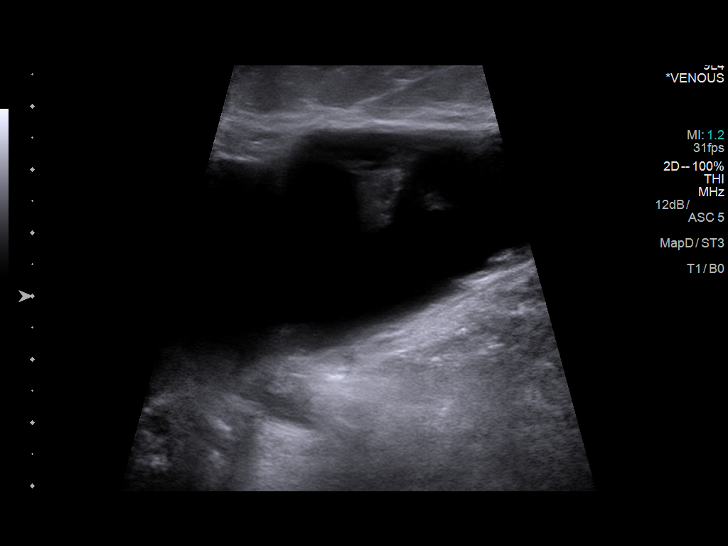
[im 34/40]
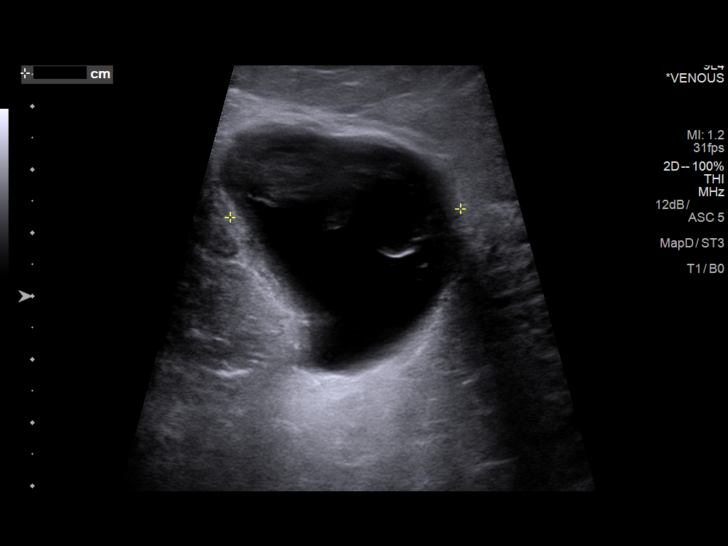
[im 38/40]
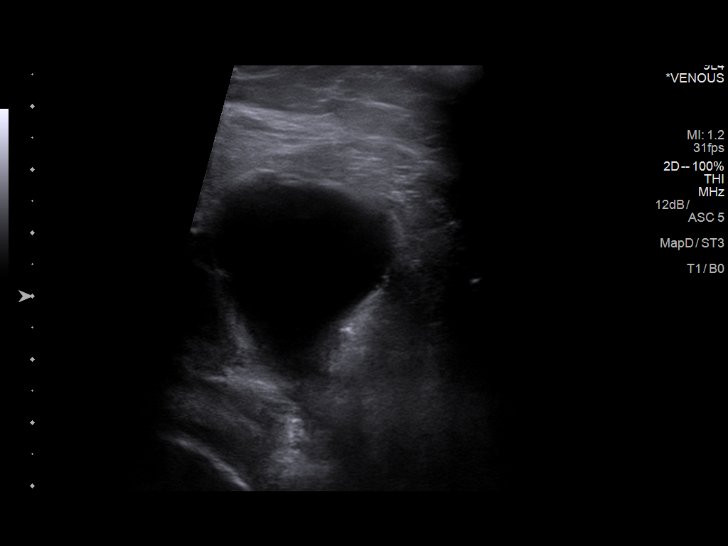
[im 40/40]
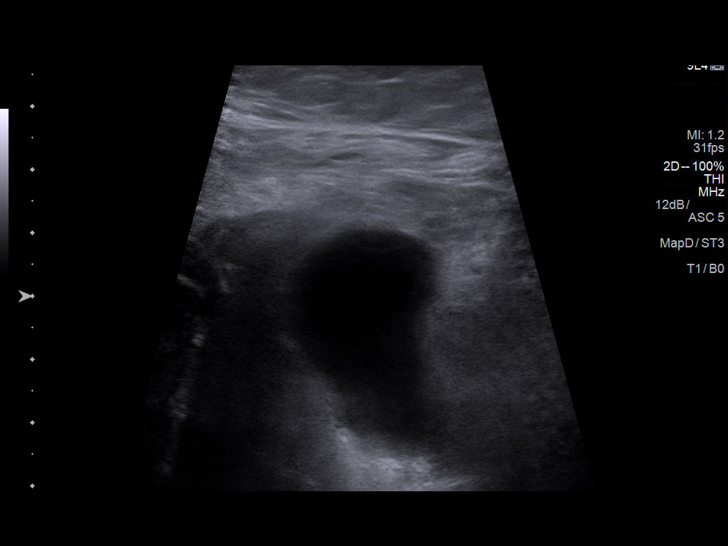

[13 of 24 positions shown; findings below may reference images not displayed]

FINDINGS: Contralateral Common Femoral Vein: Respiratory phasicity is normal
and symmetric with the symptomatic side. No evidence of thrombus.
Normal compressibility.

Common Femoral Vein: No evidence of thrombus. Normal
compressibility, respiratory phasicity and response to augmentation.

Saphenofemoral Junction: No evidence of thrombus. Normal
compressibility and flow on color Doppler imaging.

Profunda Femoral Vein: No evidence of thrombus. Normal
compressibility and flow on color Doppler imaging.

Femoral Vein: No evidence of thrombus. Normal compressibility,
respiratory phasicity and response to augmentation.

Popliteal Vein: No evidence of thrombus. Normal compressibility,
respiratory phasicity and response to augmentation.

Calf Veins: No evidence of thrombus. Normal compressibility and flow
on color Doppler imaging.

Superficial Great Saphenous Vein: No evidence of thrombus. Normal
compressibility and flow on color Doppler imaging.

Venous Reflux:  None.

Other Findings: There is a 3.6 x 3.5 x 11.9 cm complex cystic
collection with no internal vascularity in the left popliteal space
corresponding to the patient's palpable mass. This most likely
represent a complex Baker cyst.
IMPRESSION: 1. No evidence of deep venous thrombosis in the left lower
extremity.
2. Complex left popliteal fossa cyst.

## 2017-05-07 ENCOUNTER — Other Ambulatory Visit: Payer: Self-pay | Admitting: Internal Medicine

## 2017-05-08 ENCOUNTER — Other Ambulatory Visit: Payer: Self-pay | Admitting: *Deleted

## 2017-05-08 NOTE — Telephone Encounter (Signed)
Representative from [facility] left a msg on the refill vm requesting that the patients eliquis rx be changed to a ninety day supply. Thanks, MI

## 2017-05-09 ENCOUNTER — Telehealth: Payer: Self-pay | Admitting: Internal Medicine

## 2017-05-09 MED ORDER — APIXABAN 5 MG PO TABS: ORAL_TABLET | ORAL | 5 refills | Status: DC

## 2017-05-09 MED ORDER — APIXABAN 5 MG PO TABS: ORAL_TABLET | ORAL | 1 refills | Status: DC

## 2017-05-09 NOTE — Telephone Encounter (Signed)
New message     *STAT* If patient is at the pharmacy, call can be transferred to refill team.   1. Which medications need to be refilled? (please list name of each medication and dose if known) apixaban (ELIQUIS) 5 MG TABS tablet  2. Which pharmacy/location (including street and city if local pharmacy) is medication to be sent to?[facility]  [phone]  3. Do they need a 30 day or 90 day supply? 90 days

## 2017-05-09 NOTE — Telephone Encounter (Signed)
Rx send to pharmacy  

## 2017-05-09 NOTE — Telephone Encounter (Signed)
Pt saw Dr Ladona Ridgelaylor on 04/18/17. Wt 137.9Kg. Age 52 yrs old. SCr 0.68 on 09/18/16. Thus, will refill pts Eliquis

## 2017-06-07 ENCOUNTER — Other Ambulatory Visit: Payer: Self-pay | Admitting: Internal Medicine

## 2017-06-07 MED ORDER — METOPROLOL SUCCINATE ER 25 MG PO TB24: 37.5000 mg | ORAL_TABLET | Freq: Two times a day (BID) | ORAL | 3 refills | Status: DC

## 2017-06-07 NOTE — Telephone Encounter (Signed)
Pt's medication was sent to pt's pharmacy as requested. Confirmation received.  °

## 2017-08-09 ENCOUNTER — Ambulatory Visit: Payer: BLUE CROSS/BLUE SHIELD | Admitting: Medical

## 2017-08-09 ENCOUNTER — Encounter: Payer: Self-pay | Admitting: Medical

## 2017-08-09 VITALS — HR 121 | Temp 98.2°F | Resp 16 | Ht 69.0 in | Wt 307.8 lb

## 2017-08-09 DIAGNOSIS — M898X6 Other specified disorders of bone, lower leg: Secondary | ICD-10-CM | POA: Diagnosis not present

## 2017-08-09 DIAGNOSIS — M25572 Pain in left ankle and joints of left foot: Secondary | ICD-10-CM | POA: Diagnosis not present

## 2017-08-09 NOTE — Progress Notes (Signed)
Subjective:    Patient ID: Shelley Edwards, female    DOB: May 27, 1965, 52 y.o.   MRN: [id number]  HPI  Pt in with history of fall in April. She fell on deck on ice. She slipped with rt leg extended She landed with left leg back in hurdler position.  She state from her proximal tibia down to her left foot/toes were swollen. She had 4 inch dark bruise to her medial calf initially.   Pt is on Eliquis.(hx of atrial fib and flutter).  Since fall she still has some occasional ankle pain on lateral aspect.    Review of Systems  Constitutional: Negative for chills, diaphoresis, fatigue and fever.  Respiratory: Negative for cough and chest tightness.   Cardiovascular: Negative for chest pain and palpitations.  Musculoskeletal: Negative for back pain.       See hpi.  Skin: Negative for rash.  Neurological: Negative for dizziness, weakness, light-headedness, numbness and headaches.  Hematological: Negative for adenopathy.  Psychiatric/Behavioral: Negative for behavioral problems and confusion.   Past Medical History:  Diagnosis Date  . Abnormal uterine bleeding 07/2015  . Acute meniscal tear, medial    left  . H/O atrial flutter   . Headache(784.0)      Social History   Socioeconomic History  . Marital status: Married    Spouse name: Not on file  . Number of children: Not on file  . Years of education: Not on file  . Highest education level: Not on file  Occupational History  . Not on file  Social Needs  . Financial resource strain: Not on file  . Food insecurity:    Worry: Not on file    Inability: Not on file  . Transportation needs:    Medical: Not on file    Non-medical: Not on file  Tobacco Use  . Smoking status: Light Tobacco Smoker  . Smokeless tobacco: Never Used  Substance and Sexual Activity  . Alcohol use: Yes    Comment: occ  . Drug use: No  . Sexual activity: Yes    Birth control/protection: Condom  Lifestyle  . Physical activity:    Days per week:  Not on file    Minutes per session: Not on file  . Stress: Not on file  Relationships  . Social connections:    Talks on phone: Not on file    Gets together: Not on file    Attends religious service: Not on file    Active member of club or organization: Not on file    Attends meetings of clubs or organizations: Not on file    Relationship status: Not on file  . Intimate partner violence:    Fear of current or ex partner: Not on file    Emotionally abused: Not on file    Physically abused: Not on file    Forced sexual activity: Not on file  Other Topics Concern  . Not on file  Social History Narrative  . Not on file    Past Surgical History:  Procedure Laterality Date  . A-FLUTTER ABLATION N/A 09/20/2016   Procedure: A-Flutter Ablation;  Surgeon: Marinus Maw, MD;  Location: [facility];  Service: Cardiovascular;  Laterality: N/A;  . APPENDECTOMY  at age 18  . BACK SURGERY    . CHOLECYSTECTOMY  ~2002  . KNEE ARTHROSCOPY Left 07/27/2016   Procedure: LEFT KNEE ARTHROSCOPY WITH PARTIAL MEDIAL MENISCECTOMY;  Surgeon: Kathryne Hitch, MD;  Location: [facility];  Service: Orthopedics;  Laterality: Left;  . KNEE SURGERY      Family History  Problem Relation Age of Onset  . Arthritis Unknown   . Diabetes Mother   . Melanoma Mother     No Known Allergies  Current Outpatient Medications on File Prior to Visit  Medication Sig Dispense Refill  . apixaban (ELIQUIS) 5 MG TABS tablet TAKE 1 TABLET(5 MG) BY MOUTH TWICE DAILY 180 tablet 1  . flecainide (TAMBOCOR) 150 MG tablet Take 0.5 tablets (75 mg total) by mouth 2 (two) times daily. 90 tablet 1  . metoprolol succinate (TOPROL XL) 25 MG 24 hr tablet Take 1.5 tablets (37.5 mg total) by mouth 2 (two) times daily with a meal. 270 tablet 3   No current facility-administered medications on file prior to visit.     Pulse (!) 121   Temp 98.2 F (36.8 C) (Oral)   Resp 16   Ht 5\' 9"  (1.753 m)   Wt (!) 307 lb 12.8 oz (139.6  kg)   LMP  (LMP Unknown) Comment: irregular menses  SpO2 99%   BMI 45.45 kg/m       Objective:   Physical Exam   General- No acute distress. Pleasant patient. Neck- Full range of motion, no jvd Lungs- Clear, even and unlabored. Heart- regular rate and rhythm. Neurologic- CNII- XII grossly intact.   Left lower ext- negative homans signs. Upper calf is symmetric to right side. Distal calf moderate swollen. Faint indurated/swollen area. No warmth.    Left ankle- lateral malleolus mild swelling and faint tender.     Assessment & Plan:  You do have a significant contusion to left tibia region and appears to have sprained your ankle at the time of the injury 2 months ago.  The swelling is still present in both areas so I did place future x-rays of tibia/fibula and left ankle.  You can get those done tomorrow.  I do think you would benefit from daily TED hose compression stocking use and elevate your leg daily.  Continue Eliquis for atrial fibrillation.  This would also prevent DVT.  I do think it might take another 1-2  months for the residual edema to resolve.  If you do get frustrated I could offer you referral to sports medicine.  Follow-up date to be determined after x-ray review  Esperanza RichtersEdward Jayra Choyce, PA-C

## 2017-08-09 NOTE — Patient Instructions (Signed)
You do have a significant contusion to left tibia region and appears to have sprained your ankle at the time of the injury 2 months ago.  The swelling is still present in both areas so I did place future x-rays of tibia/fibula and left ankle.  You can get those done tomorrow.  I do think you would benefit from daily TED hose compression stocking use and elevate your leg daily.  Continue Eliquis for atrial fibrillation.  This would also prevent DVT.  I do think it might take another 1-2  months for the residual edema to resolve.  If you do get frustrated I could offer you referral to sports medicine.  Follow-up date to be determined after x-ray review

## 2017-08-22 IMAGING — US US EXTREM LOW VENOUS*L*
1 series · 14 of 24 positions shown · non-contrast
Comparison: None

CLINICAL DATA: Swelling x5 days, calf pain x2 weeks. Knee
arthroscopy [REDACTED].

EXAM:
LEFT LOWER EXTREMITY VENOUS DOPPLER ULTRASOUND
TECHNIQUE: Gray-scale sonography with compression, as well as color and duplex
ultrasound, were performed to evaluate the deep venous system from
the level of the common femoral vein through the popliteal and
proximal calf veins.

[Series 1: us extrem low venous*left* · 0.09mm/px · 14 of 34 slices shown]
[im 1/34]
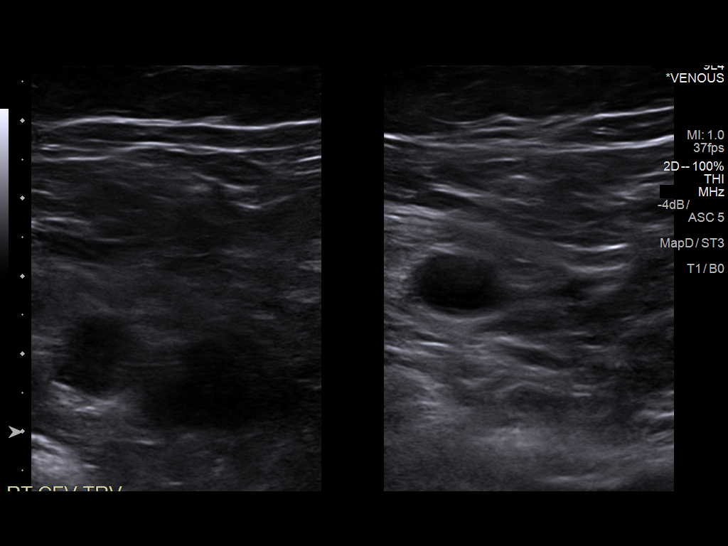
[im 3/34]
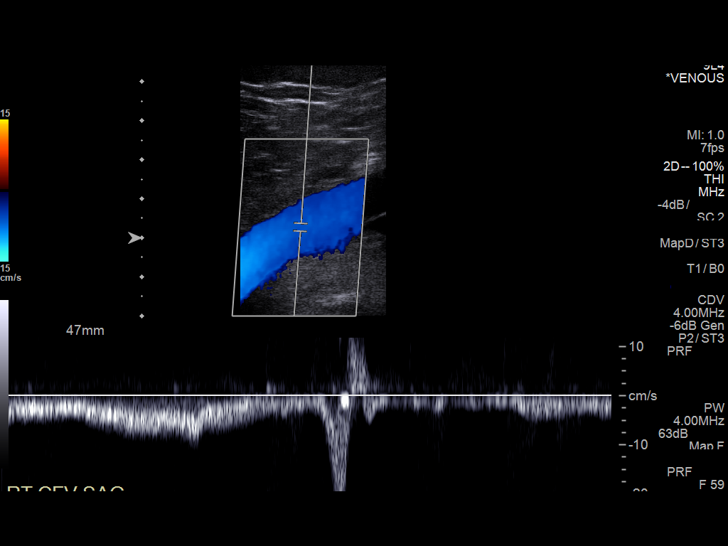
[im 6/34]
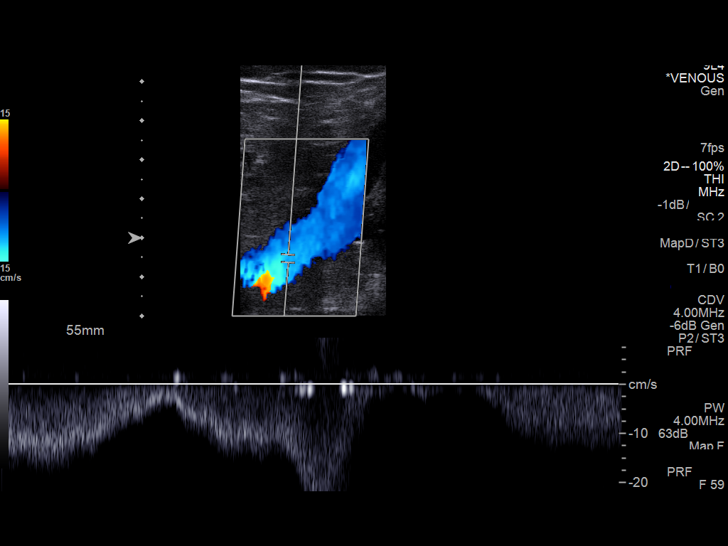
[im 9/34]
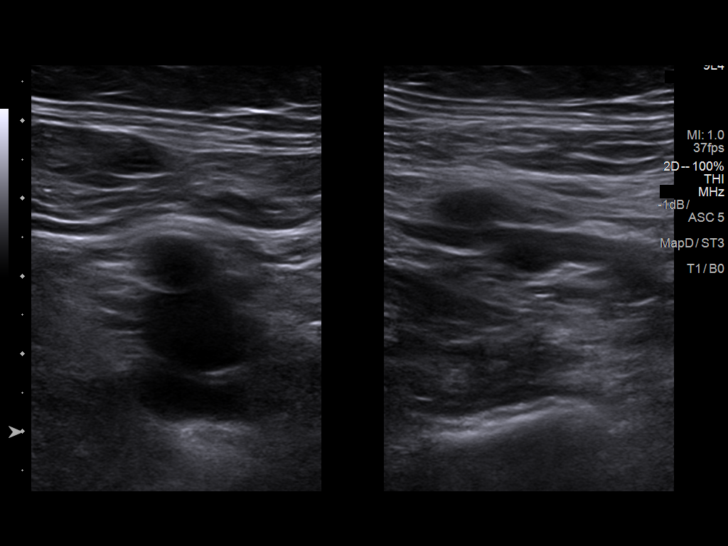
[im 11/34]
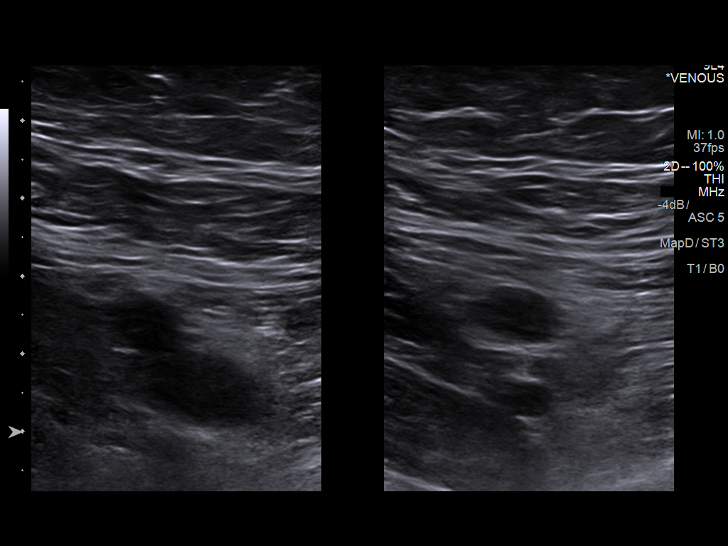
[im 13/34]
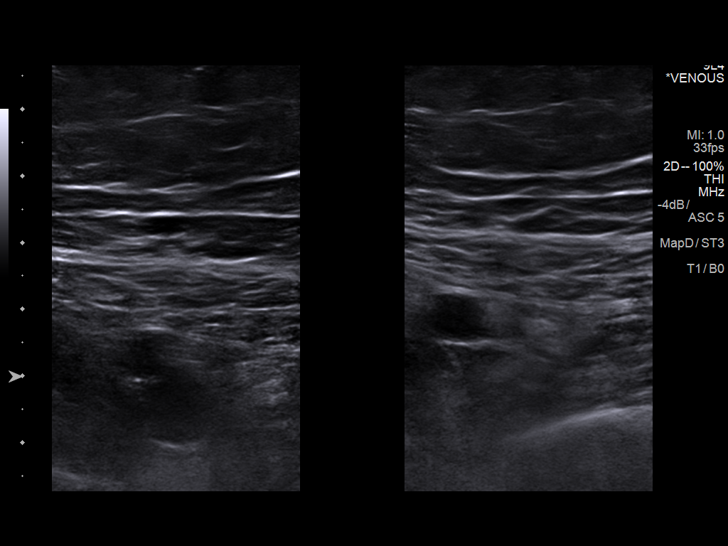
[im 16/34]
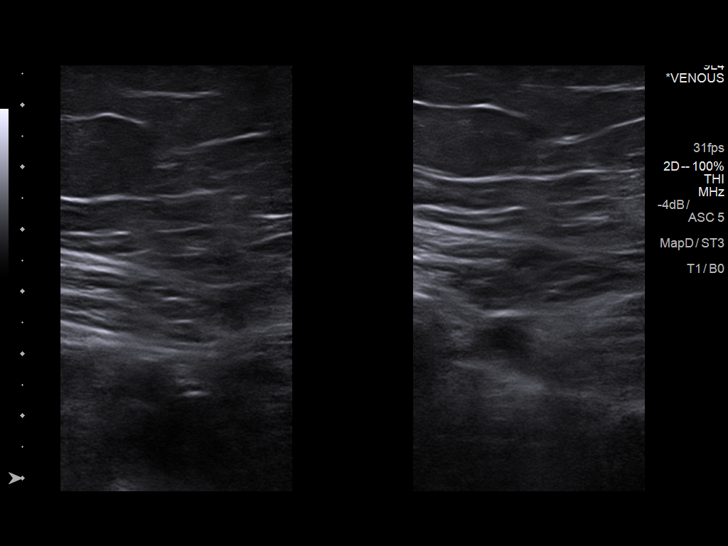
[im 18/34]
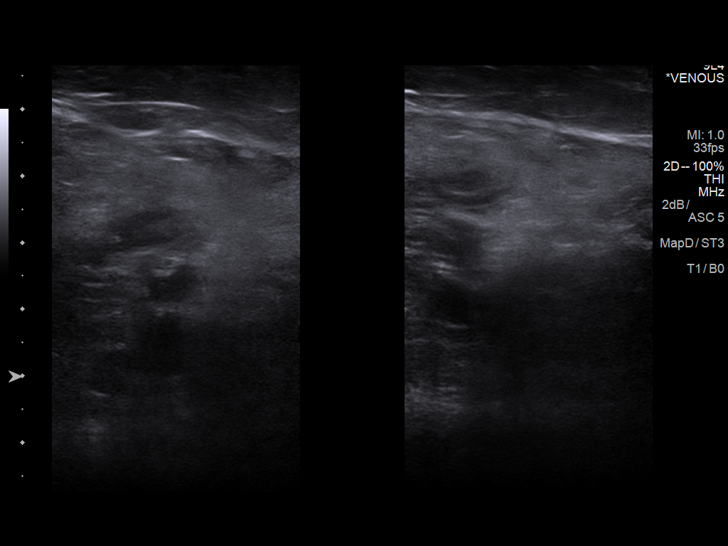
[im 21/34]
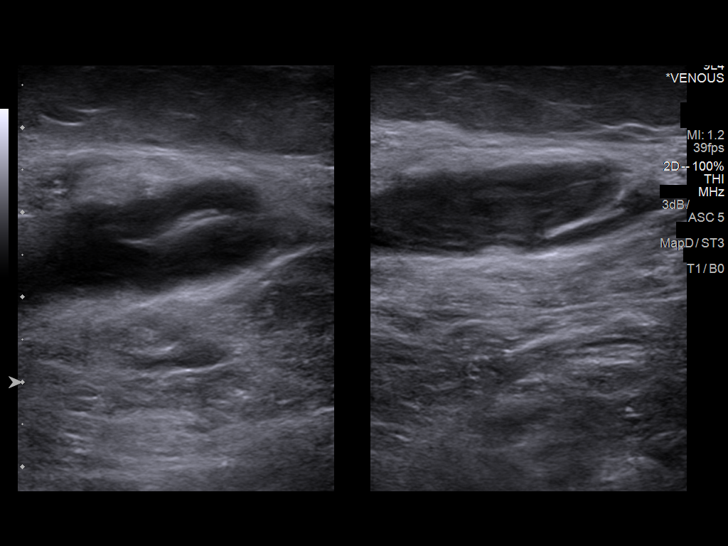
[im 23/34]
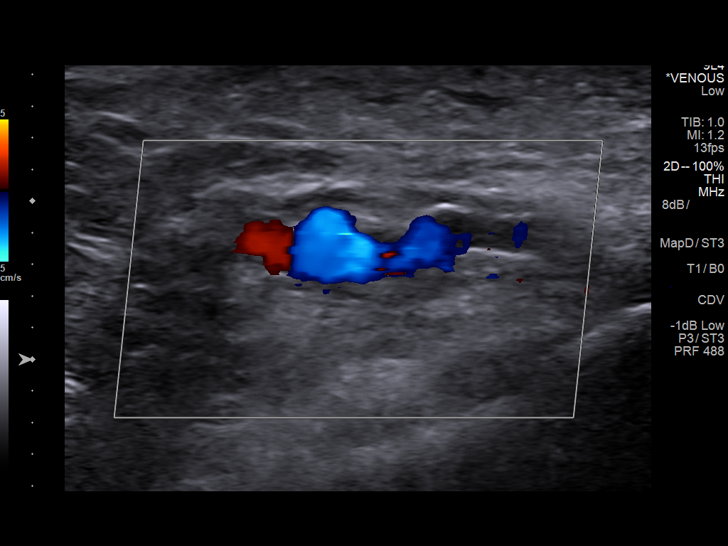
[im 26/34]
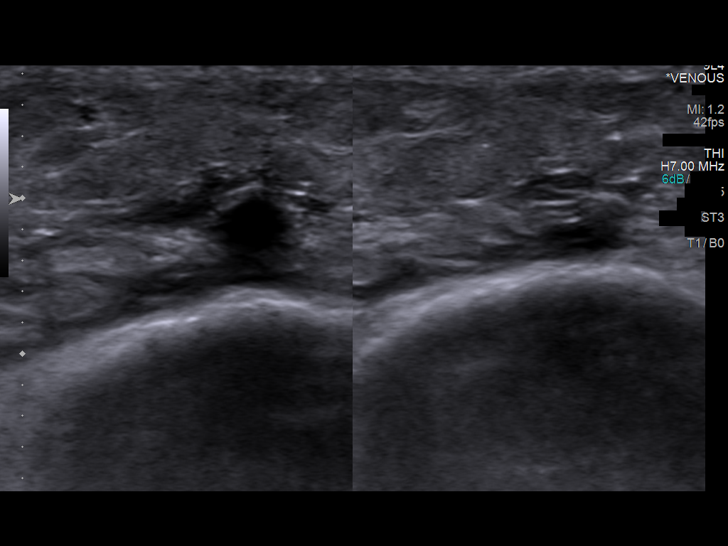
[im 28/34]
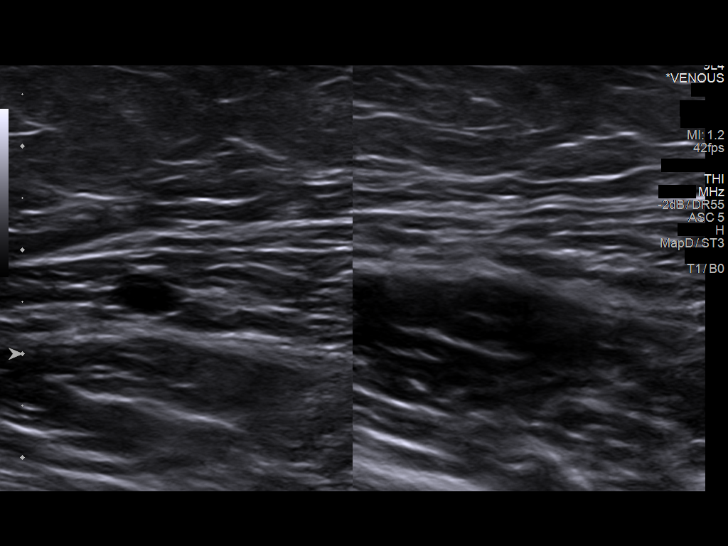
[im 31/34]
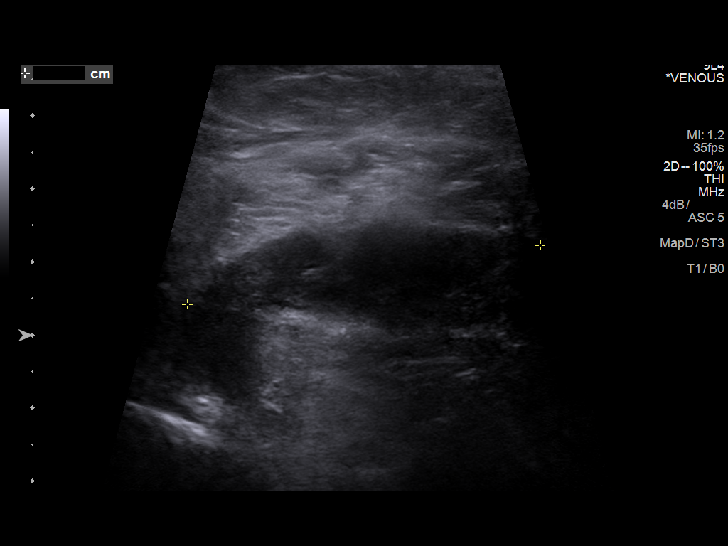
[im 34/34]
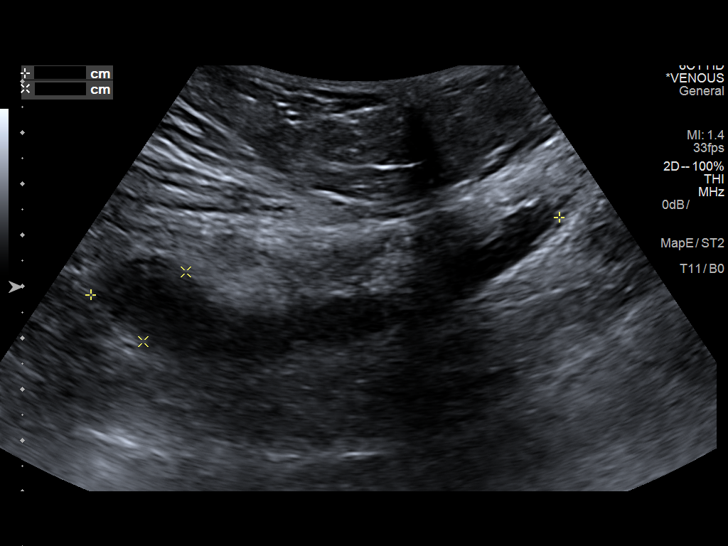

[14 of 24 positions shown; findings below may reference images not displayed]

FINDINGS: Normal compressibility of the common femoral, superficial femoral,
and popliteal veins, as well as the proximal calf veins. No filling
defects to suggest DVT on grayscale or color Doppler imaging.
Doppler waveforms show normal direction of venous flow, normal
respiratory phasicity and response to augmentation. Visualized
segments of the saphenous venous system normal in caliber and
compressibility. Elongated 9.3 x 1.6 x4.9 cm cystic collection in
the posterior popliteal fossa. Survey views of the contralateral
common femoral vein are unremarkable.
IMPRESSION: 1. Negative for left lower extremity DVT.
2. Left Baker's cyst.

## 2017-08-24 ENCOUNTER — Ambulatory Visit (HOSPITAL_BASED_OUTPATIENT_CLINIC_OR_DEPARTMENT_OTHER)
Admission: RE | Admit: 2017-08-24 | Discharge: 2017-08-24 | Disposition: A | Discharge status: Home / Self Care | Payer: BLUE CROSS/BLUE SHIELD | Source: Ambulatory Visit | Attending: Medical | Admitting: Medical

## 2017-08-24 DIAGNOSIS — S8992XA Unspecified injury of left lower leg, initial encounter: Secondary | ICD-10-CM | POA: Diagnosis not present

## 2017-08-24 DIAGNOSIS — M25572 Pain in left ankle and joints of left foot: Secondary | ICD-10-CM | POA: Insufficient documentation

## 2017-08-24 DIAGNOSIS — M7989 Other specified soft tissue disorders: Secondary | ICD-10-CM | POA: Diagnosis not present

## 2017-08-24 DIAGNOSIS — S99912A Unspecified injury of left ankle, initial encounter: Secondary | ICD-10-CM | POA: Diagnosis not present

## 2017-08-24 DIAGNOSIS — M898X6 Other specified disorders of bone, lower leg: Secondary | ICD-10-CM

## 2017-08-24 DIAGNOSIS — M79662 Pain in left lower leg: Secondary | ICD-10-CM | POA: Diagnosis not present

## 2017-10-17 ENCOUNTER — Ambulatory Visit: Payer: BLUE CROSS/BLUE SHIELD | Admitting: Medical

## 2017-10-17 ENCOUNTER — Encounter: Payer: Self-pay | Admitting: Medical

## 2017-10-17 VITALS — BP 149/88 | HR 96 | Temp 98.5°F | Resp 16 | Ht 69.0 in | Wt 307.0 lb

## 2017-10-17 DIAGNOSIS — T148XXA Other injury of unspecified body region, initial encounter: Secondary | ICD-10-CM | POA: Diagnosis not present

## 2017-10-17 DIAGNOSIS — S8012XA Contusion of left lower leg, initial encounter: Secondary | ICD-10-CM | POA: Diagnosis not present

## 2017-10-17 NOTE — Progress Notes (Signed)
Subjective:    Patient ID: Shelley Edwards, female    DOB: 11-25-1965, 52 y.o.   MRN: [id number]  HPI   Pt in for follow up on contusion. Probable slow resolving hematoma. The area on skin feels numb.  Pt is convinced that she needs surgical drainage.I offered to refer to sports medicine to see if they think this is normal variant slow healing of the contused area.  The area feel decreased sensation. Slight numb/tingling.  I saw pt early June 6,2019. Accident occurred in April, 2019.  Pt xrays were negative of tibia. She had medial ankle swelling that occurred in past.(not from recent accident per pt).    Review of Systems  Constitutional: Negative for diaphoresis, fatigue and fever.  Respiratory: Negative for cough, choking, chest tightness, shortness of breath and wheezing.   Cardiovascular: Negative for chest pain and palpitations.  Gastrointestinal: Negative for abdominal pain.  Musculoskeletal: Negative for back pain and neck pain.  Skin: Negative for rash.  Hematological: Negative for adenopathy. Does not bruise/bleed easily.  Psychiatric/Behavioral: Negative for behavioral problems and confusion. The patient is not nervous/anxious.     Past Medical History:  Diagnosis Date  . Abnormal uterine bleeding 07/2015  . Acute meniscal tear, medial    left  . H/O atrial flutter   . Headache(784.0)      Social History   Socioeconomic History  . Marital status: Married    Spouse name: Not on file  . Number of children: Not on file  . Years of education: Not on file  . Highest education level: Not on file  Occupational History  . Not on file  Social Needs  . Financial resource strain: Not on file  . Food insecurity:    Worry: Not on file    Inability: Not on file  . Transportation needs:    Medical: Not on file    Non-medical: Not on file  Tobacco Use  . Smoking status: Light Tobacco Smoker  . Smokeless tobacco: Never Used  Substance and Sexual Activity  .  Alcohol use: Yes    Comment: occ  . Drug use: No  . Sexual activity: Yes    Birth control/protection: Condom  Lifestyle  . Physical activity:    Days per week: Not on file    Minutes per session: Not on file  . Stress: Not on file  Relationships  . Social connections:    Talks on phone: Not on file    Gets together: Not on file    Attends religious service: Not on file    Active member of club or organization: Not on file    Attends meetings of clubs or organizations: Not on file    Relationship status: Not on file  . Intimate partner violence:    Fear of current or ex partner: Not on file    Emotionally abused: Not on file    Physically abused: Not on file    Forced sexual activity: Not on file  Other Topics Concern  . Not on file  Social History Narrative  . Not on file    Past Surgical History:  Procedure Laterality Date  . A-FLUTTER ABLATION N/A 09/20/2016   Procedure: A-Flutter Ablation;  Surgeon: Marinus Mawaylor, Gregg W, MD;  Location: [facility];  Service: Cardiovascular;  Laterality: N/A;  . APPENDECTOMY  at age 52  . BACK SURGERY    . CHOLECYSTECTOMY  ~2002  . KNEE ARTHROSCOPY Left 07/27/2016   Procedure: LEFT KNEE ARTHROSCOPY  WITH PARTIAL MEDIAL MENISCECTOMY;  Surgeon: Kathryne HitchBlackman, Christopher Y, MD;  [facility];  Service: Orthopedics;  Laterality: Left;  . KNEE SURGERY      Family History  Problem Relation Age of Onset  . Arthritis Unknown   . Diabetes Mother   . Melanoma Mother     No Known Allergies  Current Outpatient Medications on File Prior to Visit  Medication Sig Dispense Refill  . apixaban (ELIQUIS) 5 MG TABS tablet TAKE 1 TABLET(5 MG) BY MOUTH TWICE DAILY 180 tablet 1  . flecainide (TAMBOCOR) 150 MG tablet Take 0.5 tablets (75 mg total) by mouth 2 (two) times daily. 90 tablet 1  . HYDROcodone-acetaminophen (NORCO/VICODIN) 5-325 MG tablet hydrocodone 5 mg-acetaminophen 325 mg tablet    . medroxyPROGESTERone (PROVERA) 10 MG tablet  medroxyprogesterone 10 mg tablet  TK 1 T PO QD FOR 14 DAYS    . metoprolol succinate (TOPROL XL) 25 MG 24 hr tablet Take 1.5 tablets (37.5 mg total) by mouth 2 (two) times daily with a meal. 270 tablet 3   No current facility-administered medications on file prior to visit.     BP (!) 149/88   Pulse 96   Temp 98.5 F (36.9 C) (Oral)   Resp 16   Ht 5\' 9"  (1.753 m)   Wt (!) 307 lb (139.3 kg)   LMP  (LMP Unknown) Comment: irregular menses  SpO2 99%   BMI 45.34 kg/m       Objective:   Physical Exam  General- No acute distress. Pleasant patient.But expresses frustration about the pretibial area not returning to normal appearance. Lungs- Clear, even and unlabored. Heart- regular rate and rhythm. Neurologic- CNII- XII grossly intact.  Left lower ext- no obvious calf swelling. Appears symmetric compared to rt side. Slight hyperpigmented area lower 1/3 tibia about 6 cm in length vertically. Faint induration. No warmth. No fluctuation. With of area about 3-4 cm on palpation(pt expresses she thinks area is larger) negative homans signs.       Assessment & Plan:  For your history of contusion and possible persisting small hematoma over tibial region, I am going to refer you to surgeon at your request.  Hopefully you will get call within 7 to 10 days for appointment.  If not please notify me.  In the event of delay referral I would consider ordering ultrasound of the lower extremity and asking radiology tech to concentrate on the pretibial region.  Follow-up date to be determined depending on how quickly we get referral to specialist.  Pt has had this since April. I saw her in June. The area is not worsening just not getting better per her report.  Pt declines sport medicine referral.  Note I do not think she has any infection component.  Esperanza RichtersEdward Camillia Marcy, PA-C

## 2017-10-17 NOTE — Patient Instructions (Signed)
For your history of contusion and possible persisting small hematoma over tibial region, I am going to refer you to surgeon at your request.  Hopefully you will get call within 7 to 10 days for appointment.  If not please notify me.  In the event of delay referral I would consider ordering ultrasound of the lower extremity and asking radiology tech to concentrate on the pretibial region.  Follow-up date to be determined depending on how quickly we get referral to specialist.

## 2017-11-16 DIAGNOSIS — M7981 Nontraumatic hematoma of soft tissue: Secondary | ICD-10-CM | POA: Diagnosis not present

## 2017-11-19 ENCOUNTER — Other Ambulatory Visit: Payer: Self-pay | Admitting: Internal Medicine

## 2017-11-21 ENCOUNTER — Ambulatory Visit: Payer: BLUE CROSS/BLUE SHIELD | Admitting: Internal Medicine

## 2017-11-21 ENCOUNTER — Encounter: Payer: Self-pay | Admitting: Internal Medicine

## 2017-11-21 VITALS — BP 116/68 | HR 69 | Ht 69.0 in | Wt 307.2 lb

## 2017-11-21 DIAGNOSIS — I4892 Unspecified atrial flutter: Secondary | ICD-10-CM | POA: Diagnosis not present

## 2017-11-21 DIAGNOSIS — R002 Palpitations: Secondary | ICD-10-CM

## 2017-11-21 DIAGNOSIS — Z79899 Other long term (current) drug therapy: Secondary | ICD-10-CM | POA: Diagnosis not present

## 2017-11-21 NOTE — Patient Instructions (Addendum)

## 2017-11-21 NOTE — Progress Notes (Signed)
HPI Shelley Edwards returns today for followup of atrial fib and flutter. She is a pleasant 52 yo woman with the above problems, s/p ablation of atrial flutter and continuation of flecainide. She developed recurrent atrial arrhythmias mostly atypical flutter although we cannot rule out typical flutter. She has had a gradual uptitration of her medications. She initially felt well but now is much better. She denies chest pain, sob, or edema but she admits to being fairly sedentary. No syncope. She is frustrated by her inability to lose weight. When she wore her heart monitor her ave HR was 100. She has felt better recently.  No Known Allergies   Current Outpatient Medications  Medication Sig Dispense Refill  . apixaban (ELIQUIS) 5 MG TABS tablet TAKE 1 TABLET(5 MG) BY MOUTH TWICE DAILY 180 tablet 1  . flecainide (TAMBOCOR) 150 MG tablet TAKE 1/2 TABLET(75 MG) BY MOUTH TWICE DAILY 90 tablet 1  . metoprolol succinate (TOPROL XL) 25 MG 24 hr tablet Take 1.5 tablets (37.5 mg total) by mouth 2 (two) times daily with a meal. 270 tablet 3   No current facility-administered medications for this visit.      Past Medical History:  Diagnosis Date  . Abnormal uterine bleeding 07/2015  . Acute meniscal tear, medial    left  . H/O atrial flutter   . Headache(784.0)     ROS:   All systems reviewed and negative except as noted in the HPI.   Past Surgical History:  Procedure Laterality Date  . A-FLUTTER ABLATION N/A 09/20/2016   Procedure: A-Flutter Ablation;  Surgeon: Marinus Mawaylor, Gregg W, MD;  Location: [facility];  Service: Cardiovascular;  Laterality: N/A;  . APPENDECTOMY  at age 52  . BACK SURGERY    . CHOLECYSTECTOMY  ~2002  . KNEE ARTHROSCOPY Left 07/27/2016   Procedure: LEFT KNEE ARTHROSCOPY WITH PARTIAL MEDIAL MENISCECTOMY;  Surgeon: Kathryne HitchBlackman, Christopher Y, MD;  Location: [facility] OR;  Service: Orthopedics;  Laterality: Left;  . KNEE SURGERY       Family History  Problem Relation  Age of Onset  . Arthritis Unknown   . Diabetes Mother   . Melanoma Mother      Social History   Socioeconomic History  . Marital status: Married    Spouse name: Not on file  . Number of children: Not on file  . Years of education: Not on file  . Highest education level: Not on file  Occupational History  . Not on file  Social Needs  . Financial resource strain: Not on file  . Food insecurity:    Worry: Not on file    Inability: Not on file  . Transportation needs:    Medical: Not on file    Non-medical: Not on file  Tobacco Use  . Smoking status: Light Tobacco Smoker  . Smokeless tobacco: Never Used  Substance and Sexual Activity  . Alcohol use: Yes    Comment: occ  . Drug use: No  . Sexual activity: Yes    Birth control/protection: Condom  Lifestyle  . Physical activity:    Days per week: Not on file    Minutes per session: Not on file  . Stress: Not on file  Relationships  . Social connections:    Talks on phone: Not on file    Gets together: Not on file    Attends religious service: Not on file    Active member of club or organization: Not on file  Attends meetings of clubs or organizations: Not on file    Relationship status: Not on file  . Intimate partner violence:    Fear of current or ex partner: Not on file    Emotionally abused: Not on file    Physically abused: Not on file    Forced sexual activity: Not on file  Other Topics Concern  . Not on file  Social History Narrative  . Not on file     BP 116/68   Pulse 69   Ht 5\' 9"  (1.753 m)   Wt (!) 307 lb 3.2 oz (139.3 kg)   LMP  (LMP Unknown) Comment: irregular menses  SpO2 96%   BMI 45.37 kg/m   Physical Exam:  Well appearing NAD HEENT: Unremarkable Neck:  No JVD, no thyromegally Lymphatics:  No adenopathy Back:  No CVA tenderness Lungs:  Clear with no wheezes HEART:  Regular rate rhythm, no murmurs, no rubs, no clicks Abd:  soft, positive bowel sounds, no organomegally, no rebound,  no guarding Ext:  2 plus pulses, no edema, no cyanosis, no clubbing Skin:  No rashes no nodules Neuro:  CN II through XII intact, motor grossly intact  EKG - nsr   Assess/Plan: 1. Atrial flutter - she is doing well after ablation.  2. Atrial fib - her fib is controlled at this present time. She is at risk for recurrence 3. Obesity - she is back exercising but I have strongly encouraged that the patient try to lose weight.   Leonia Reeves.D.

## 2018-03-15 ENCOUNTER — Telehealth: Payer: Self-pay | Admitting: Internal Medicine

## 2018-03-15 NOTE — Telephone Encounter (Signed)
New message  ° °No message needed °

## 2018-03-18 ENCOUNTER — Telehealth: Payer: Self-pay | Admitting: Internal Medicine

## 2018-03-18 NOTE — Telephone Encounter (Signed)
New Message   1. What dental office are you calling from? [facility] Dr. Jason Fila  2. What is your office phone number? [phone]   3. What is your fax number? [phone]   4. What type of procedure is the patient having performed? extraction   5. What date is procedure scheduled or is the patient there now? Patient is there now  (if the patient is at the dentist's office question goes to their cardiologist if he/she is in the office.  If not, question should go to the DOD).    6. What is your question (ex. Antibiotics prior to procedure, holding medication-we need to know how long dentist wants pt to hold med)? Cleared for extraction and if Eliquis needs to be held.

## 2018-03-18 NOTE — Telephone Encounter (Signed)
Chart was sent through [facility].  Will route to pharmD for input on Eliquis. Typically don't hold anticoag for simple extractions but bone graft may require holding. Aaliayah Miao PA-C

## 2018-03-18 NOTE — Telephone Encounter (Signed)
Spoke with Restaurant manager[employer].  Plan for Pt to have an extraction with bone graft in the future.  No date at this time.  Forwarding to preop.

## 2018-03-19 NOTE — Telephone Encounter (Signed)
Dental office faxed over updated clearance; Date for first part of surgery will be on 04/04/18

## 2018-03-19 NOTE — Telephone Encounter (Addendum)
Patient with diagnosis of aflutter on apixaban for anticoagulation.    Procedure: tooth extraction with bone graft Date of procedure: TBD  CHADS2-VASc score of  2 (CHF, HTN, AGE, DM2, stroke/tia x 2, CAD, AGE, female)  Per office protocol, patient can hold apixaban for one days prior to procedure.

## 2018-04-03 NOTE — Telephone Encounter (Signed)
F/U Message         Patient needs clarification on her Eliquis for tomorrow's appt.

## 2018-04-04 NOTE — Telephone Encounter (Signed)
    Called patient to notify them of recommendations for holding Eliquis and left a voicemail with our recommendations. Given that procedure was scheduled for today, I will remove from the preop pool at this time.   Beatriz Stallion, PA-C 04/04/18; 2:53 PM

## 2018-05-13 ENCOUNTER — Other Ambulatory Visit: Payer: Self-pay | Admitting: Internal Medicine

## 2018-05-24 ENCOUNTER — Encounter: Payer: Self-pay | Admitting: Internal Medicine

## 2018-05-29 ENCOUNTER — Other Ambulatory Visit: Payer: Self-pay | Admitting: Internal Medicine

## 2018-05-29 IMAGING — DX DG KNEE AP/LAT W/ SUNRISE*L*
3 series · 3 of 3 positions shown · non-contrast
Comparison: None.

CLINICAL DATA: LEFT knee pain for 6 weeks.

EXAM:
LEFT KNEE 3 VIEWS

[knee ap]
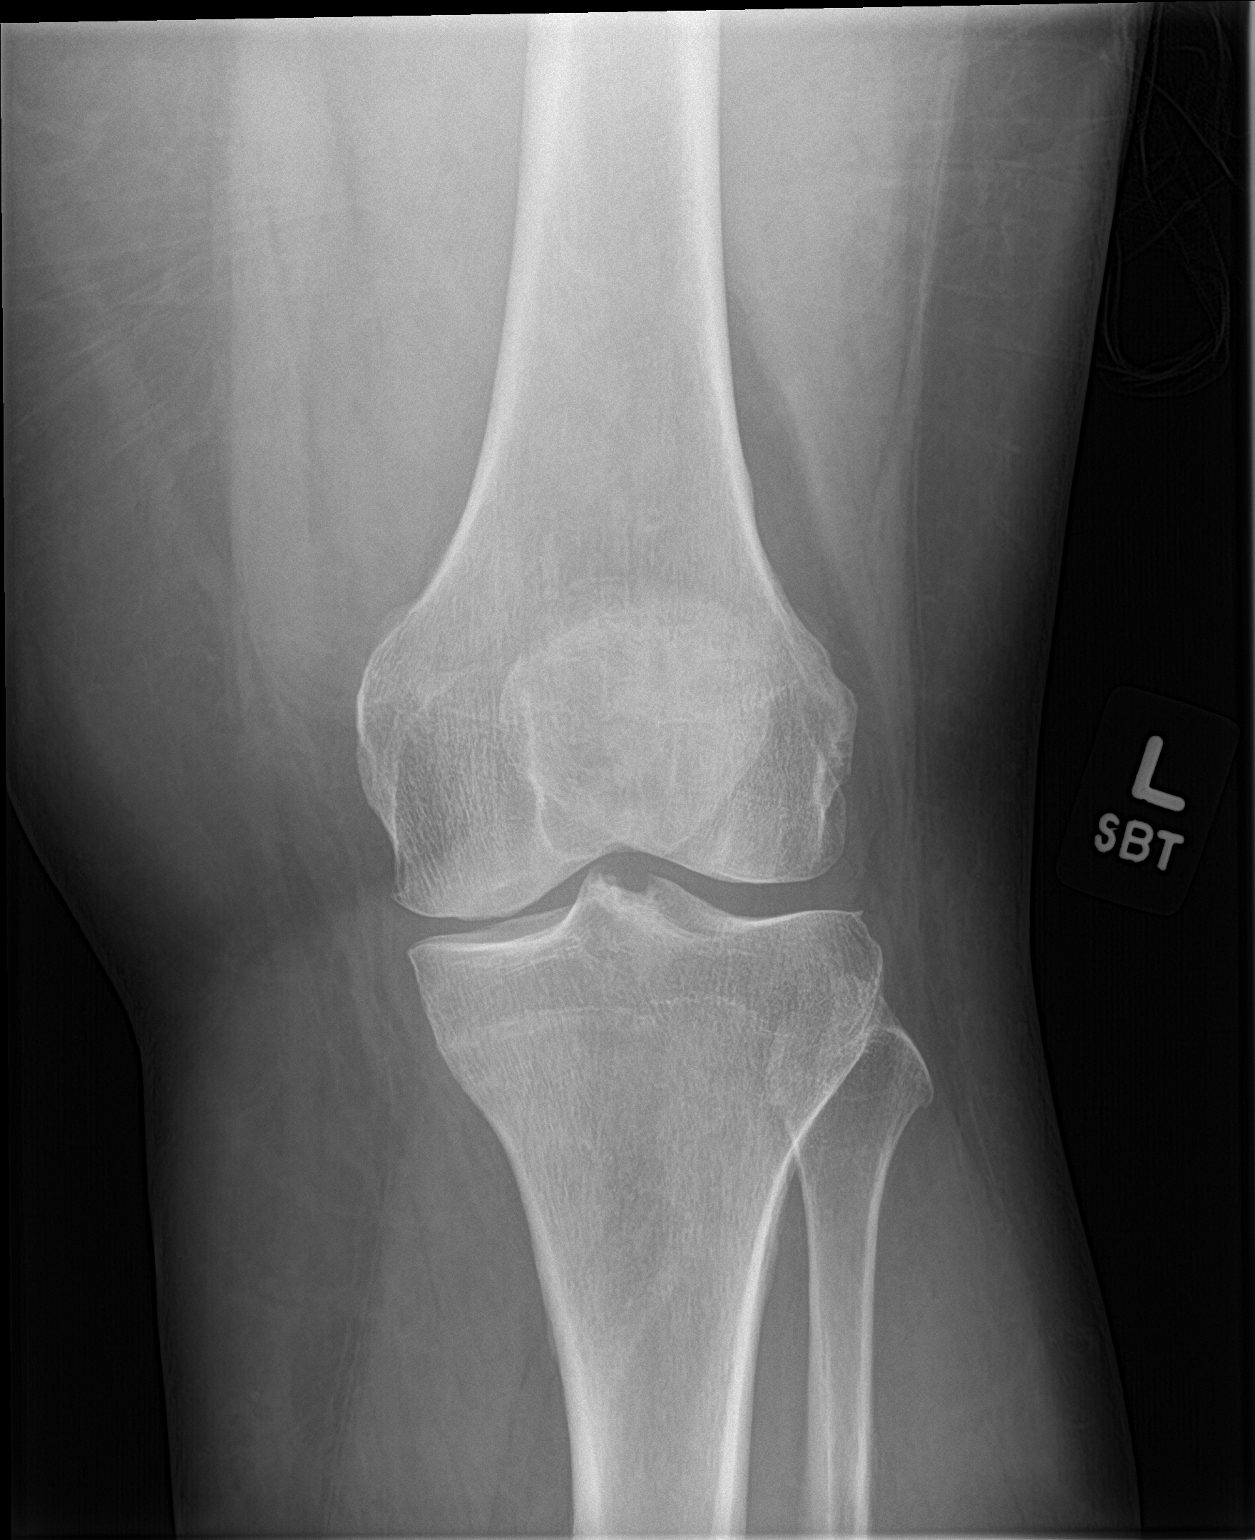

[knee lat]
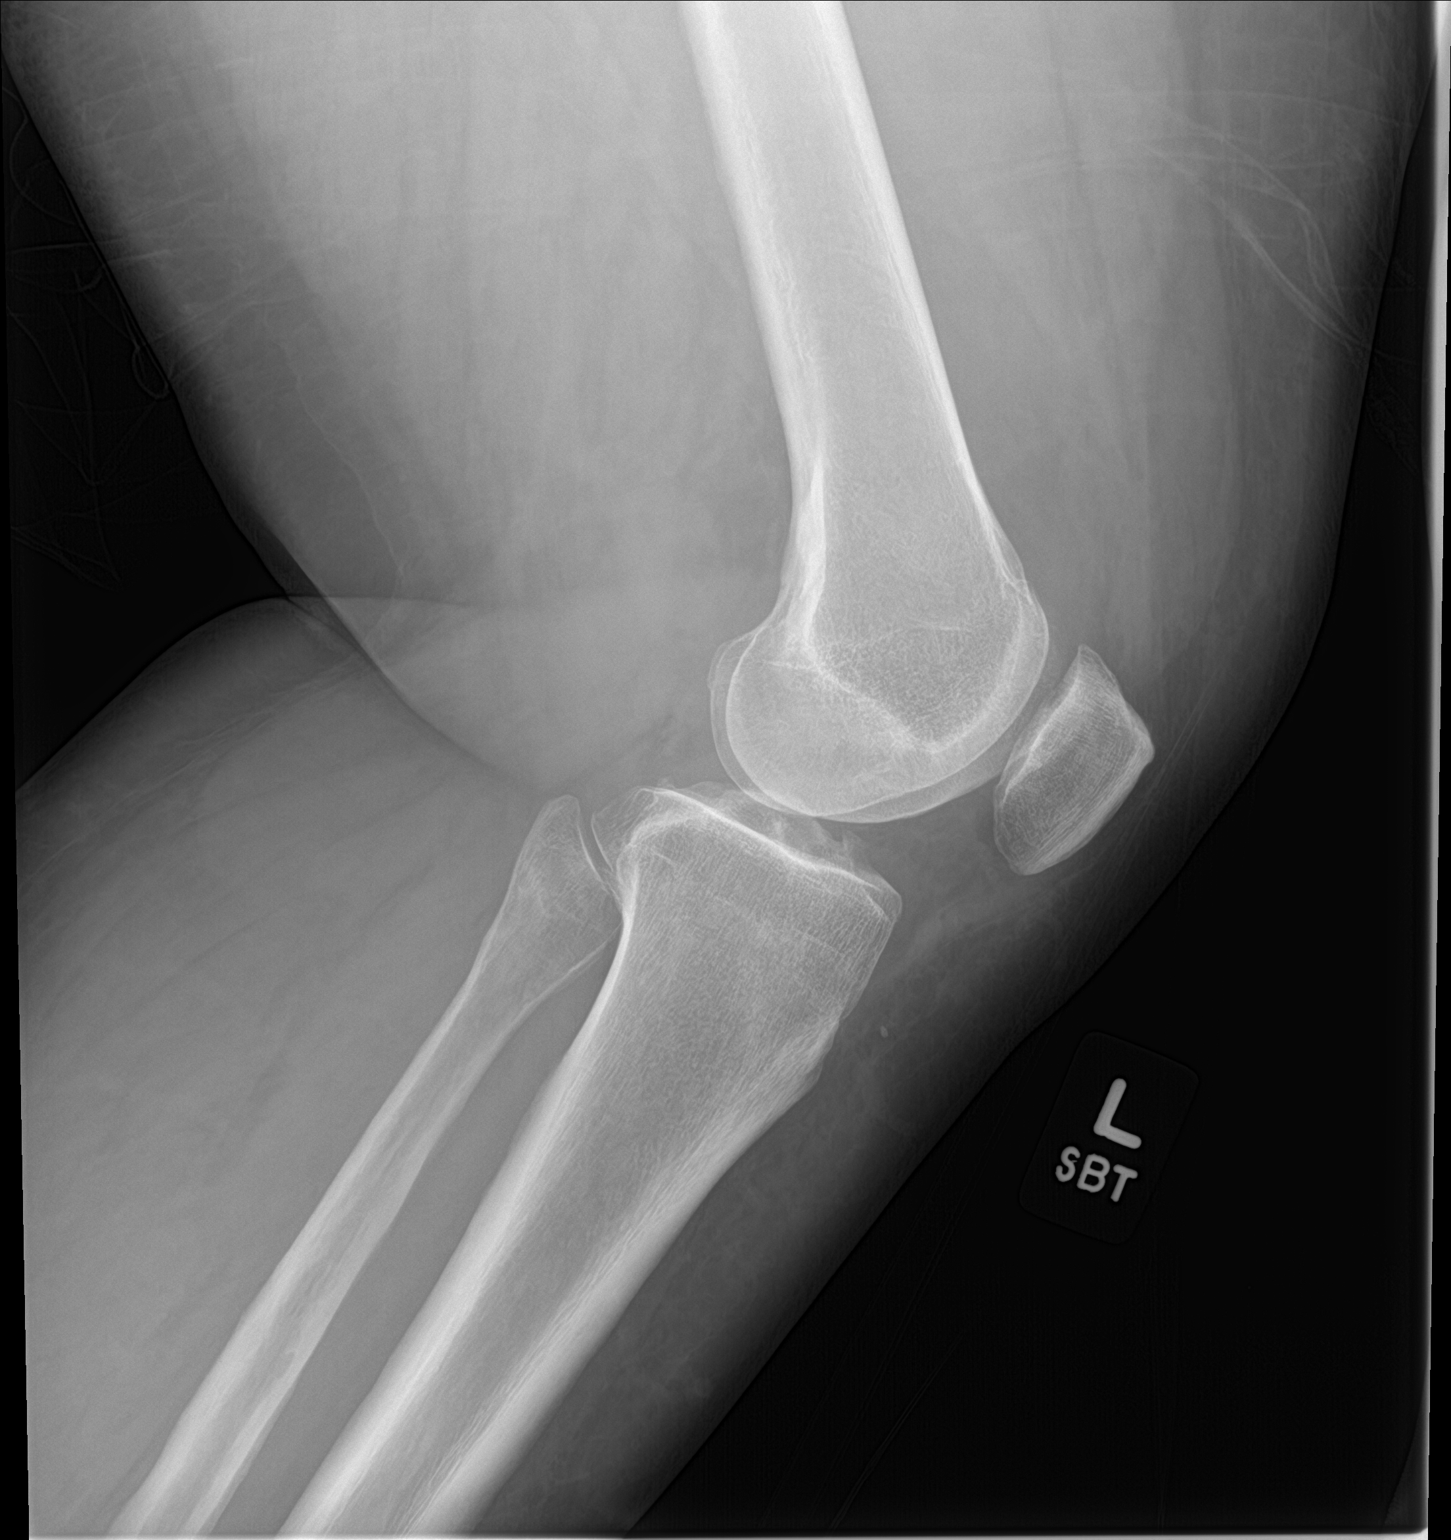

[knee sunrise]
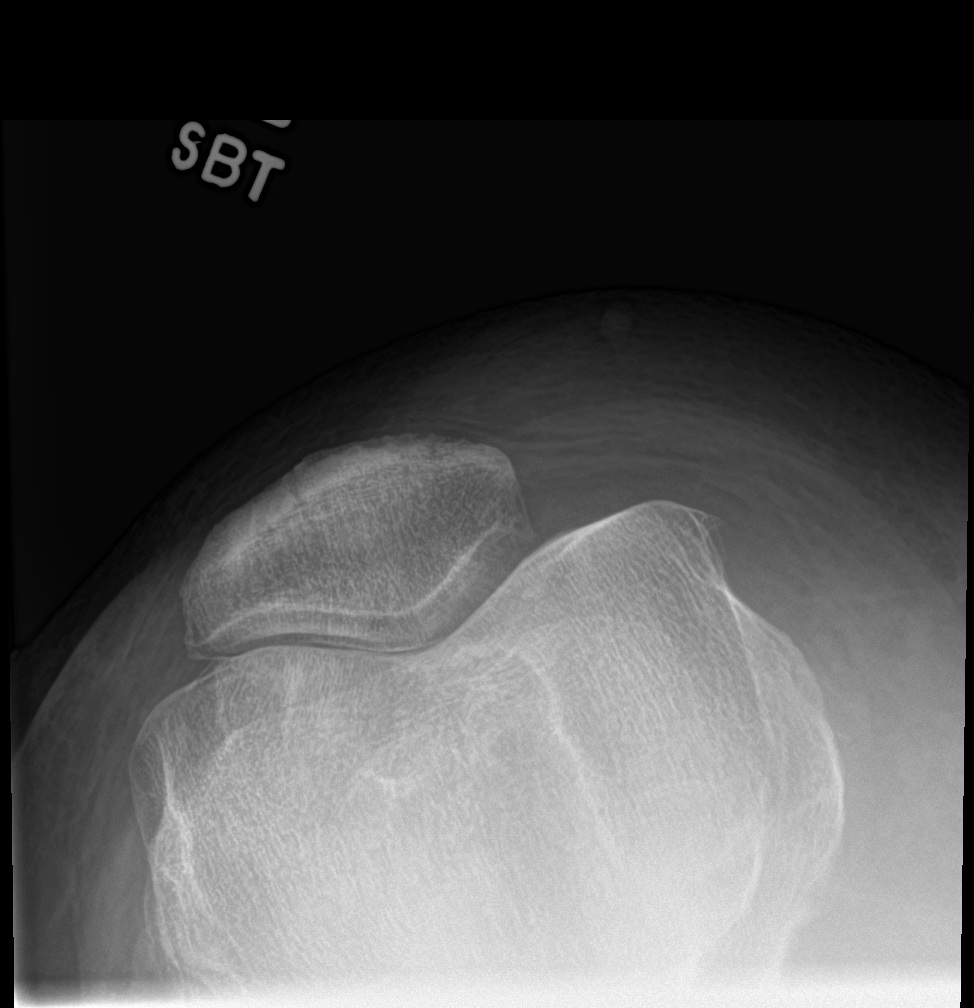

[3 of 3 positions shown; findings below may reference images not displayed]

FINDINGS: No evidence of fracture, or dislocation. Positive for joint
effusion. Medial compartment narrowing. Patella intact. Increased
body habitus.
IMPRESSION: Degenerative joint disease with effusion. No fracture or
dislocation.

## 2018-05-31 ENCOUNTER — Other Ambulatory Visit: Payer: Self-pay | Admitting: *Deleted

## 2018-05-31 MED ORDER — APIXABAN 5 MG PO TABS: ORAL_TABLET | ORAL | 0 refills | Status: DC

## 2018-05-31 NOTE — Telephone Encounter (Signed)
Last OV 11/21/17 

## 2018-06-04 ENCOUNTER — Ambulatory Visit: Payer: BLUE CROSS/BLUE SHIELD | Admitting: Internal Medicine

## 2018-08-27 ENCOUNTER — Other Ambulatory Visit: Payer: Self-pay | Admitting: Internal Medicine

## 2018-08-27 NOTE — Telephone Encounter (Signed)
Pt has upcoming appt scheduled with Dr Lovena Le on 09/03/18 in the office.  Placed note on appt that pt needs CBC and BMP drawn at [facility] f/u Eliquis labwork.  Will refill Eliquis x 1 to get to upcoming appt.

## 2018-08-27 NOTE — Telephone Encounter (Signed)
Pt is a 45yof requesting eliquis 5mg . Wt 139.3kg, scr(outdated),lov w/ Cristopher Peru (11/21/17) DXAFLUTTER Called the pt and left a msg stating that labs are needed in order to refill... Awaiting a call back

## 2018-09-03 ENCOUNTER — Ambulatory Visit: Payer: BLUE CROSS/BLUE SHIELD | Admitting: Internal Medicine

## 2018-09-18 ENCOUNTER — Telehealth: Payer: Self-pay | Admitting: Internal Medicine

## 2018-09-18 NOTE — Telephone Encounter (Signed)

## 2018-09-19 ENCOUNTER — Encounter: Payer: Self-pay | Admitting: Internal Medicine

## 2018-09-19 ENCOUNTER — Ambulatory Visit (INDEPENDENT_AMBULATORY_CARE_PROVIDER_SITE_OTHER): Payer: BC Managed Care – PPO | Admitting: Internal Medicine

## 2018-09-19 ENCOUNTER — Other Ambulatory Visit: Payer: Self-pay

## 2018-09-19 VITALS — BP 128/84 | HR 67 | Ht 69.0 in | Wt 315.0 lb

## 2018-09-19 DIAGNOSIS — I480 Paroxysmal atrial fibrillation: Secondary | ICD-10-CM | POA: Diagnosis not present

## 2018-09-19 DIAGNOSIS — I872 Venous insufficiency (chronic) (peripheral): Secondary | ICD-10-CM | POA: Diagnosis not present

## 2018-09-19 DIAGNOSIS — I483 Typical atrial flutter: Secondary | ICD-10-CM | POA: Diagnosis not present

## 2018-09-19 NOTE — Progress Notes (Signed)
PCP:  Wanda PlumpPaz, Jose E, MD Primary Cardiologist: Lewayne BuntingGregg Abdelaziz Westenberger, MD Electrophysiologist: None   Shelley Edwards is a 53 y.o. female with h/o afib, aflutter s/p ablation, and obesity who presents today for routine electrophysiology follow up. Since last being seen in our clinic, the patient reports doing very well.  She feels like she has 15-30 minute episodes of atrial fibrillation 2-3 times a week, most typically brought on by stress. She usually tolerates these well, but occasionally takes an extra 75 mg of flecainide. She denies symptoms chest pain, shortness of breath, orthopnea, PND, lower extremity edema, claudication, dizziness, presyncope, syncope, bleeding, or neurologic sequela. The patient is tolerating medications without difficulties.  She has been under increased stress as she is going through a divorce with her husband.  The patient feels that she is tolerating medications without difficulties and is otherwise without complaint today.   Past Medical History:  Diagnosis Date  . Abnormal uterine bleeding 07/2015  . Acute meniscal tear, medial    left  . H/O atrial flutter   . [id number](478.2Headache(784.0)    Past Surgical History:  Procedure Laterality Date  . A-FLUTTER ABLATION N/A 09/20/2016   Procedure: A-Flutter Ablation;  Surgeon: Marinus Mawaylor, Nakea Gouger W, MD;  Location: [facility];  Service: Cardiovascular;  Laterality: N/A;  . APPENDECTOMY  at age 53  . BACK SURGERY    . CHOLECYSTECTOMY  ~2002  . KNEE ARTHROSCOPY Left 07/27/2016   Procedure: LEFT KNEE ARTHROSCOPY WITH PARTIAL MEDIAL MENISCECTOMY;  Surgeon: Kathryne HitchBlackman, Christopher Y, MD;  Location: [facility] OR;  Service: Orthopedics;  Laterality: Left;  . KNEE SURGERY      Current Outpatient Medications  Medication Sig Dispense Refill  . apixaban (ELIQUIS) 5 MG TABS tablet Take 1 tablet (5 mg total) by mouth 2 (two) times daily. Needs Labwork for further refills 180 tablet 0  . flecainide (TAMBOCOR) 150 MG tablet TAKE 1/2 TABLET(75 MG) BY MOUTH  TWICE DAILY 90 tablet 1  . metoprolol succinate (TOPROL-XL) 25 MG 24 hr tablet TAKE 1 AND 1/2 TABLETS BY MOUTH TWICE DAILY WITH A MEAL 270 tablet 1   No current facility-administered medications for this visit.    No Known Allergies  Social History   Socioeconomic History  . Marital status: Married    Spouse name: Not on file  . Number of children: Not on file  . Years of education: Not on file  . Highest education level: Not on file  Occupational History  . Not on file  Social Needs  . Financial resource strain: Not on file  . Food insecurity    Worry: Not on file    Inability: Not on file  . Transportation needs    Medical: Not on file    Non-medical: Not on file  Tobacco Use  . Smoking status: Light Tobacco Smoker  . Smokeless tobacco: Never Used  Substance and Sexual Activity  . Alcohol use: Yes    Comment: occ  . Drug use: No  . Sexual activity: Yes    Birth control/protection: Condom  Lifestyle  . Physical activity    Days per week: Not on file    Minutes per session: Not on file  . Stress: Not on file  Relationships  . Social Musicianconnections    Talks on phone: Not on file    Gets together: Not on file    Attends religious service: Not on file    Active member of club or organization: Not on file    Attends  meetings of clubs or organizations: Not on file    Relationship status: Not on file  . Intimate partner violence    Fear of current or ex partner: Not on file    Emotionally abused: Not on file    Physically abused: Not on file    Forced sexual activity: Not on file  Other Topics Concern  . Not on file  Social History Narrative  . Not on file     Review of Systems: General: No chills, fever, night sweats or weight changes  Cardiovascular:  No chest pain, dyspnea on exertion, edema, orthopnea, palpitations, paroxysmal nocturnal dyspnea Dermatological: No rash, lesions or masses Respiratory: No cough, dyspnea Urologic: No hematuria, dysuria  Abdominal: No nausea, vomiting, diarrhea, bright red blood per rectum, melena, or hematemesis Neurologic: No visual changes, weakness, changes in mental status All other systems reviewed and are otherwise negative except as noted above.  Physical Exam: Vitals:   09/19/18 1609  BP: 128/84  Pulse: 67  SpO2: 97%  Weight: (!) 315 lb (142.9 kg)  Height: 5\' 9"  (1.753 m)    GEN- The patient is well appearing, alert and oriented x 3 today.   HEENT: normocephalic, atraumatic; sclera clear, conjunctiva pink; hearing intact; oropharynx clear; neck supple, no JVP Lymph- no cervical lymphadenopathy Lungs- Clear to ausculation bilaterally, normal work of breathing.  No wheezes, rales, rhonchi Heart- Regular rate and rhythm, no murmurs, rubs or gallops, PMI not laterally displaced GI- soft, non-tender, non-distended, bowel sounds present, no hepatosplenomegaly Extremities- no clubbing, cyanosis, or edema; DP/PT/radial pulses 2+ bilaterally MS- no significant deformity or atrophy Skin- warm and dry, no rash or lesion Psych- euthymic mood, full affect Neuro- strength and sensation are intact  EKG is ordered today. Personal review shows NSR   Assessment and Plan:  1. Atrial fibrillation - She continues to have paroxysms of Afib on flecainide. She takes extra flecainide prn. Stable on Eliquis.  2. Atrial flutter s/p ablation 09/2016 - Stable s/p ablation with no recurrence.  3. Obesity - Encouraged weight loss. Pt is aware. She snores, but wants to lose weight and avoid a sleep study if she can.   RTC 6 months. Will check CBC and BMP today.  Cristopher Peru, MD  09/19/18 9:02 PM

## 2018-09-19 NOTE — Patient Instructions (Addendum)
Medication Instructions:  Your physician recommends that you continue on your current medications as directed. Please refer to the Current Medication list given to you today.  Labwork: You will get lab work today:  BMP and CBC  Testing/Procedures: None ordered.  Follow-Up: Your physician wants you to follow-up in: 6 months with Dr. Lovena Le.   You will receive a reminder letter in the mail two months in advance. If you don't receive a letter, please call our office to schedule the follow-up appointment.   Any Other Special Instructions Will Be Listed Below (If Applicable).  If you need a refill on your cardiac medications before your next appointment, please call your pharmacy.

## 2018-09-20 LAB — CBC WITH DIFFERENTIAL/PLATELET
Basophils Absolute: 0.1 10*3/uL (ref 0.0–0.2)
Basos: 1 %
EOS (ABSOLUTE): 0.4 10*3/uL (ref 0.0–0.4)
Eos: 4 %
Hematocrit: 43.2 % (ref 34.0–46.6)
Hemoglobin: 14.3 g/dL (ref 11.1–15.9)
Immature Grans (Abs): 0 10*3/uL (ref 0.0–0.1)
Immature Granulocytes: 0 %
Lymphocytes Absolute: 3.6 10*3/uL — ABNORMAL HIGH (ref 0.7–3.1)
Lymphs: 33 %
MCH: 31.2 pg (ref 26.6–33.0)
MCHC: 33.1 g/dL (ref 31.5–35.7)
MCV: 94 fL (ref 79–97)
Monocytes Absolute: 0.9 10*3/uL (ref 0.1–0.9)
Monocytes: 8 %
Neutrophils Absolute: 5.8 10*3/uL (ref 1.4–7.0)
Neutrophils: 54 %
Platelets: 383 10*3/uL (ref 150–450)
RBC: 4.58 x10E6/uL (ref 3.77–5.28)
RDW: 12.8 % (ref 11.7–15.4)
WBC: 10.7 10*3/uL (ref 3.4–10.8)

## 2018-09-20 LAB — BASIC METABOLIC PANEL
BUN/Creatinine Ratio: 11 (ref 9–23)
BUN: 11 mg/dL (ref 6–24)
CO2: 23 mmol/L (ref 20–29)
Calcium: 9.5 mg/dL (ref 8.7–10.2)
Chloride: 102 mmol/L (ref 96–106)
Creatinine, Ser: 0.98 mg/dL (ref 0.57–1.00)
GFR calc Af Amer: 77 mL/min/{1.73_m2} (ref >59)
GFR calc non Af Amer: 67 mL/min/{1.73_m2} (ref >59)
Glucose: 101 mg/dL — ABNORMAL HIGH (ref 65–99)
Potassium: 4.4 mmol/L (ref 3.5–5.2)
Sodium: 140 mmol/L (ref 134–144)

## 2018-10-03 ENCOUNTER — Encounter: Payer: Self-pay | Admitting: Internal Medicine

## 2018-10-22 ENCOUNTER — Other Ambulatory Visit: Payer: Self-pay

## 2018-10-22 DIAGNOSIS — I872 Venous insufficiency (chronic) (peripheral): Secondary | ICD-10-CM

## 2018-10-24 ENCOUNTER — Other Ambulatory Visit: Payer: Self-pay

## 2018-10-24 ENCOUNTER — Ambulatory Visit (HOSPITAL_COMMUNITY)
Admission: RE | Admit: 2018-10-24 | Discharge: 2018-10-24 | Disposition: A | Discharge status: Home / Self Care | Payer: BC Managed Care – PPO | Source: Ambulatory Visit | Attending: Family | Admitting: Family

## 2018-10-24 ENCOUNTER — Ambulatory Visit (INDEPENDENT_AMBULATORY_CARE_PROVIDER_SITE_OTHER): Payer: BC Managed Care – PPO | Admitting: Vascular Surgery

## 2018-10-24 ENCOUNTER — Encounter: Payer: Self-pay | Admitting: Vascular Surgery

## 2018-10-24 VITALS — BP 118/68 | HR 96 | Temp 96.9°F | Resp 16 | Ht 69.0 in | Wt 310.0 lb

## 2018-10-24 DIAGNOSIS — I872 Venous insufficiency (chronic) (peripheral): Secondary | ICD-10-CM

## 2018-10-24 NOTE — Progress Notes (Signed)
REASON FOR CONSULT:    Left lower extremity venous insufficiency.  The consult is requested by Dr. Sharrell KuGreg Taylor.  ASSESSMENT & PLAN:   CHRONIC VENOUS INSUFFICIENCY: Based on her venous duplex scan she does have some evidence of chronic venous insufficiency with reflux at the saphenofemoral junction in the great saphenous vein in the calf.  She did not have any evidence of DVT or deep venous reflux.  She has CEAP C3 venous disease.  I have recommended multiple conservative measures.  We have discussed the importance of intermittent leg elevation and the proper positioning for this.  I encouraged her to consider getting an inflatable leg rest for her long travels as this appears to be when she is having the most problems.  She flies [place] quite a bit.  I have encouraged her to wear compression stockings and I have written her a prescription for knee-high compression stockings with a gradient of 15 to 20 mmHg.  We have discussed the importance of avoiding prolonged sitting and standing.  We also discussed the importance of exercise specifically walking and water aerobics.  In addition we discussed the importance of weight management as central obesity especially increases lower extremity venous pressure.  If her swelling worsens then I think we could consider a CT venogram to rule out a may Thurner syndrome.  I think the yield for the CT would be fairly low.  In addition we do not always aggressively treat may Thurner syndrome given the risks of having a venous stent long-term.  I will be happy to see her back at any time if her symptoms progress.   Waverly Ferrarihristopher Lynnwood Beckford, MD, FACS Beeper [phone] Office: [phone]   HPI:   Shelley Edwards is a pleasant 53 y.o. female, who presents with left lower extremity swelling.  She has had swelling mostly in the left lower extremity for the last 2 years.  She has no previous history of DVT or phlebitis.  She has no history of lymphedema.  She said several  previous abdominal operations including cholecystectomy and appendectomy.  She travels overseas and on her long flights get significant left lower extremity swelling.  This is not completely relieved with elevation.  She does try to elevate her legs some.  She also tries to wear compression stockings.  She does get some aching pain in her legs which is aggravated by sitting and standing relieved with elevation.  She is on Eliquis for atrial fibrillation.  Past Medical History:  Diagnosis Date  . Abnormal uterine bleeding 07/2015  . Acute meniscal tear, medial    left  . H/O atrial flutter   . Headache(784.0)     Family History  Problem Relation Age of Onset  . Arthritis Other   . Diabetes Mother   . Melanoma Mother     SOCIAL HISTORY: Social History   Socioeconomic History  . Marital status: Married    Spouse name: Not on file  . Number of children: Not on file  . Years of education: Not on file  . Highest education level: Not on file  Occupational History  . Not on file  Social Needs  . Financial resource strain: Not on file  . Food insecurity    Worry: Not on file    Inability: Not on file  . Transportation needs    Medical: Not on file    Non-medical: Not on file  Tobacco Use  . Smoking status: Light Tobacco Smoker  . Smokeless tobacco: Never Used  Substance and Sexual Activity  . Alcohol use: Yes    Comment: occ  . Drug use: No  . Sexual activity: Yes    Birth control/protection: Condom  Lifestyle  . Physical activity    Days per week: Not on file    Minutes per session: Not on file  . Stress: Not on file  Relationships  . Social Herbalist on phone: Not on file    Gets together: Not on file    Attends religious service: Not on file    Active member of club or organization: Not on file    Attends meetings of clubs or organizations: Not on file    Relationship status: Not on file  . Intimate partner violence    Fear of current or ex partner:  Not on file    Emotionally abused: Not on file    Physically abused: Not on file    Forced sexual activity: Not on file  Other Topics Concern  . Not on file  Social History Narrative  . Not on file    No Known Allergies  Current Outpatient Medications  Medication Sig Dispense Refill  . apixaban (ELIQUIS) 5 MG TABS tablet Take 1 tablet (5 mg total) by mouth 2 (two) times daily. Needs Labwork for further refills 180 tablet 0  . flecainide (TAMBOCOR) 150 MG tablet TAKE 1/2 TABLET(75 MG) BY MOUTH TWICE DAILY 90 tablet 1  . metoprolol succinate (TOPROL-XL) 25 MG 24 hr tablet TAKE 1 AND 1/2 TABLETS BY MOUTH TWICE DAILY WITH A MEAL 270 tablet 1   No current facility-administered medications for this visit.     REVIEW OF SYSTEMS:  [X]  denotes positive finding, [ ]  denotes negative finding Cardiac  Comments:  Chest pain or chest pressure: x   Shortness of breath upon exertion:    Short of breath when lying flat:    Irregular heart rhythm: x       Vascular    Pain in calf, thigh, or hip brought on by ambulation:    Pain in feet at night that wakes you up from your sleep:     Blood clot in your veins:    Leg swelling:  x       Pulmonary    Oxygen at home:    Productive cough:     Wheezing:         Neurologic    Sudden weakness in arms or legs:     Sudden numbness in arms or legs:     Sudden onset of difficulty speaking or slurred speech:    Temporary loss of vision in one eye:     Problems with dizziness:         Gastrointestinal    Blood in stool:     Vomited blood:         Genitourinary    Burning when urinating:     Blood in urine:        Psychiatric    Major depression:         Hematologic    Bleeding problems:    Problems with blood clotting too easily:        Skin    Rashes or ulcers:        Constitutional    Fever or chills:     PHYSICAL EXAM:   Vitals:   10/24/18 1034  Resp: 16  Weight: (!) 310 lb (140.6 kg)  Height: 5\' 9"  (1.753 m)  GENERAL: The patient is a well-nourished female, in no acute distress. The vital signs are documented above. CARDIAC: There is a regular rate and rhythm.  VASCULAR: I do not detect carotid bruits. I was unable to palpate pedal pulses however she does have biphasic dorsalis pedis and posterior tibial signals bilaterally. She does have moderate bilateral lower extremity swelling more significant on the left side.  The right calf measures 19-1/2 inches in diameter.  The left calf measures 20 inches in diameter. She does not have any significant hyperpigmentation.  She has no large truncal varicosities. I did look at her saphenous vein myself with the SonoSite and and it becomes superficial in the proximal thigh.  It is not especially dilated. PULMONARY: There is good air exchange bilaterally without wheezing or rales. ABDOMEN: Soft and non-tender with normal pitched bowel sounds.  MUSCULOSKELETAL: There are no major deformities or cyanosis. NEUROLOGIC: No focal weakness or paresthesias are detected. SKIN: There are no ulcers or rashes noted. PSYCHIATRIC: The patient has a normal affect.  DATA:    VENOUS DUPLEX: I have independently interpreted her venous duplex scan.  On the left side there is no evidence of DVT or superficial thrombophlebitis.  There is no deep venous reflux.  The only superficial venous reflux is at the saphenofemoral junction and in the left great saphenous vein in the calf.

## 2018-11-04 ENCOUNTER — Other Ambulatory Visit: Payer: Self-pay | Admitting: Internal Medicine

## 2018-11-23 DIAGNOSIS — Z20828 Contact with and (suspected) exposure to other viral communicable diseases: Secondary | ICD-10-CM | POA: Diagnosis not present

## 2018-11-23 LAB — NOVEL CORONAVIRUS, NAA: SARS-CoV-2, NAA: DETECTED

## 2018-11-27 ENCOUNTER — Other Ambulatory Visit: Payer: Self-pay | Admitting: Internal Medicine

## 2018-11-27 NOTE — Telephone Encounter (Signed)
44f 140.6kg Scr 0.98 09/19/18 Lovw/taylor 09/19/18

## 2019-02-18 ENCOUNTER — Telehealth: Payer: Self-pay | Admitting: Internal Medicine

## 2019-02-18 NOTE — Telephone Encounter (Signed)
New Message     Pt c/o medication issue:  1. Name of Medication: Eliquis   2. How are you currently taking this medication (dosage and times per day)? 5 mg 1 x daily   3. Are you having a reaction (difficulty breathing--STAT)? No   4. What is your medication issue? Pt is calling and says she has a card for this medication to reduce the price and it expires and wants to see about getting another one

## 2019-02-18 NOTE — Telephone Encounter (Signed)
Called patient- confirmed she is taking Eliquis BID. Advised patient go to Eliquis website and reactive the copay card. Instructions given.

## 2019-03-11 ENCOUNTER — Encounter: Payer: Self-pay | Admitting: Internal Medicine

## 2019-03-11 ENCOUNTER — Other Ambulatory Visit: Payer: Self-pay

## 2019-03-11 ENCOUNTER — Ambulatory Visit (INDEPENDENT_AMBULATORY_CARE_PROVIDER_SITE_OTHER): Payer: BC Managed Care – PPO | Admitting: Internal Medicine

## 2019-03-11 VITALS — Temp 99.2°F

## 2019-03-11 DIAGNOSIS — J40 Bronchitis, not specified as acute or chronic: Secondary | ICD-10-CM | POA: Diagnosis not present

## 2019-03-11 MED ORDER — HYDROCODONE-HOMATROPINE 5-1.5 MG/5ML PO SYRP: 5.0000 mL | ORAL_SOLUTION | Freq: Three times a day (TID) | ORAL | 0 refills | Status: DC | PRN

## 2019-03-11 MED ORDER — AZITHROMYCIN 250 MG PO TABS: ORAL_TABLET | ORAL | 0 refills | Status: DC

## 2019-03-11 MED ORDER — PREDNISONE 10 MG PO TABS: ORAL_TABLET | ORAL | 0 refills | Status: DC

## 2019-03-11 NOTE — Progress Notes (Signed)
Subjective:    Patient ID: Shelley Edwards, female    DOB: 1965-10-06, 54 y.o.   MRN: [id number]  DOS:  03/11/2019 Type of visit - description: Virtual Visit via Video Note  I connected with the above patient  by a video enabled telemedicine application and verified that I am speaking with the correct person using two identifiers.   THIS ENCOUNTER IS A VIRTUAL VISIT DUE TO COVID-19 - PATIENT WAS NOT SEEN IN THE OFFICE. PATIENT HAS CONSENTED TO VIRTUAL VISIT / TELEMEDICINE VISIT   Location of patient: home  Location of provider: office  I discussed the limitations of evaluation and management by telemedicine and the availability of in person appointments. The patient expressed understanding and agreed to proceed.  History of Present Illness: Acute Symptoms started approximately 03/07/2019: Sore throat and runny nose. Then she gradually developed other symptoms: Dry cough, no sputum production but associated with some chest congestion. Also wheezing on and off. The cough is severe and sometimes she cannot sleep. She is taking Coricidin HBP OTC which helps.  She did spend new year eve with 10 other people indoors.   Review of Systems Denies fever chills No chest pain No nausea, vomiting, diarrhea Normal smell and taste although her appetite is a slightly decreased. No headaches except when she coughs  Past Medical History:  Diagnosis Date  . Abnormal uterine bleeding 07/2015  . Acute meniscal tear, medial    left  . H/O atrial flutter   . HFWYOVZC(588.5)     Past Surgical History:  Procedure Laterality Date  . A-FLUTTER ABLATION N/A 09/20/2016   Procedure: A-Flutter Ablation;  Surgeon: Evans Lance, MD;  Location: [facility];  Service: Cardiovascular;  Laterality: N/A;  . APPENDECTOMY  at age 24  . BACK SURGERY    . CHOLECYSTECTOMY  ~2002  . KNEE ARTHROSCOPY Left 07/27/2016   Procedure: LEFT KNEE ARTHROSCOPY WITH PARTIAL MEDIAL MENISCECTOMY;  Surgeon: Mcarthur Rossetti, MD;  Location: [facility];  Service: Orthopedics;  Laterality: Left;  . KNEE SURGERY      Social History   Socioeconomic History  . Marital status: Married    Spouse name: Not on file  . Number of children: Not on file  . Years of education: Not on file  . Highest education level: Not on file  Occupational History  . Not on file  Tobacco Use  . Smoking status: Light Tobacco Smoker  . Smokeless tobacco: Never Used  Substance and Sexual Activity  . Alcohol use: Yes    Comment: occ  . Drug use: No  . Sexual activity: Yes    Birth control/protection: Condom  Other Topics Concern  . Not on file  Social History Narrative  . Not on file   Social Determinants of Health   Financial Resource Strain:   . Difficulty of Paying Living Expenses: Not on file  Food Insecurity:   . Worried About Charity fundraiser in the Last Year: Not on file  . Ran Out of Food in the Last Year: Not on file  Transportation Needs:   . Lack of Transportation (Medical): Not on file  . Lack of Transportation (Non-Medical): Not on file  Physical Activity:   . Days of Exercise per Week: Not on file  . Minutes of Exercise per Session: Not on file  Stress:   . Feeling of Stress : Not on file  Social Connections:   . Frequency of Communication with Friends and Family: Not on  file  . Frequency of Social Gatherings with Friends and Family: Not on file  . Attends Religious Services: Not on file  . Active Member of Clubs or Organizations: Not on file  . Attends Banker Meetings: Not on file  . Marital Status: Not on file  Intimate Partner Violence:   . Fear of Current or Ex-Partner: Not on file  . Emotionally Abused: Not on file  . Physically Abused: Not on file  . Sexually Abused: Not on file      Allergies as of 03/11/2019   No Known Allergies     Medication List       Accurate as of March 11, 2019  2:01 PM. If you have any questions, ask your nurse or doctor.          Eliquis 5 MG Tabs tablet Generic drug: apixaban TAKE 1 TABLET(5 MG) BY MOUTH TWICE DAILY   flecainide 150 MG tablet Commonly known as: TAMBOCOR TAKE 1/2 TABLET(75 MG) BY MOUTH TWICE DAILY   metoprolol succinate 25 MG 24 hr tablet Commonly known as: TOPROL-XL TAKE 1 AND 1/2 TABLETS BY MOUTH TWICE DAILY WITH A MEAL           Objective:   Physical Exam Temp 99.2 F (37.3 C) (Temporal)   LMP  (LMP Unknown) Comment: irregular menses This is a virtual video visit, alert oriented x3, in no distress, speaking in complete sentences, no cough or wheezing noted.     Assessment     ASSESSMENT P- A fib, ablation 09/2016  Pre monopausal , LMP ~ 07-2015   Plan: Bronchitis. Symptoms as described above, most likely due to bronchitis. She was diagnosed with Covid 11-2018. We discussed further testing versus treatment, and at this point we agreed on empirical treatment. Plan:  Rest, fluids, continue Coricidin HBP which is helping. Also Zithromax, cough control with hydrocodone which she has used before successfully. Low-dose prednisone. She is concerned about anything that could trigger atrial fibrillation consequently will hold albuterol less she continued wheezing. Definitely call if not improving in few days or if she has severe symptoms. Called prescriptions sent  I discussed the assessment and treatment plan with the patient. The patient was provided an opportunity to ask questions and all were answered. The patient agreed with the plan and demonstrated an understanding of the instructions.   The patient was advised to call back or seek an in-person evaluation if the symptoms worsen or if the condition fails to improve as anticipated.

## 2019-03-12 NOTE — Assessment & Plan Note (Signed)
Bronchitis. Symptoms as described above, most likely due to bronchitis. She was diagnosed with Covid 11-2018. We discussed further testing versus treatment, and at this point we agreed on empirical treatment. Plan:  Rest, fluids, continue Coricidin HBP which is helping. Also Zithromax, cough control with hydrocodone which she has used before successfully. Low-dose prednisone. She is concerned about anything that could trigger atrial fibrillation consequently will hold albuterol less she continued wheezing. Definitely call if not improving in few days or if she has severe symptoms. Called prescriptions sent

## 2019-05-20 ENCOUNTER — Other Ambulatory Visit: Payer: Self-pay | Admitting: Internal Medicine

## 2019-05-21 NOTE — Telephone Encounter (Signed)
Prescription refill request for Eliquis received.  Last office visit: 09/19/2018, Ladona Ridgel Scr: 0.98, 09/19/2018 Age: 54 y.o. Weight: 140.6 kg   Prescription refill sent.

## 2019-05-23 ENCOUNTER — Telehealth: Payer: Self-pay

## 2019-05-23 NOTE — Telephone Encounter (Signed)
I started a Eliquis PA through covermymeds. Key: [id number]

## 2019-05-23 NOTE — Telephone Encounter (Signed)
**Note De-Identified Duyen Beckom Obfuscation** Message received from Covermymeds: Yesena Reaves Key: [id number] - PA Case ID: [id number] - Rx #: [id number] Outcome Approved today Case [id number] Status:Approved Review Type:Prior Auth;Coverage Start Date:04/23/2019;Coverage End Date:05/22/2020 Drug Eliquis 5MG  tablets Form [employer] Electronic PA Form (2017 NCPDP) Original Claim Info 75 CALL HELP DESK  I have notified [facility] of this approval

## 2019-07-10 ENCOUNTER — Encounter: Payer: Self-pay | Admitting: Internal Medicine

## 2019-07-11 ENCOUNTER — Telehealth: Payer: Self-pay | Admitting: Nurse Practitioner

## 2019-07-11 NOTE — Telephone Encounter (Signed)
Called to discuss with Janace Aris about Covid symptoms and the use of bamlanivimab, a monoclonal antibody infusion for those with mild to moderate Covid symptoms and at a high risk of hospitalization.     Pt is qualified for this infusion at the [facility] infusion center due to co-morbid conditions and/or a member of an at-risk group (BMI >35). Unable to see recent positive results in chart however, patient also was positive back in 12/2018.   Unable to reach. Voicemail left for further discussions.   Patient Active Problem List   Diagnosis Date Noted  . Paroxysmal atrial fibrillation (HCC) 09/19/2018  . Atrial flutter (HCC) 08/04/2016  . Status post arthroscopy of left knee 08/03/2016  . Acute medial meniscal tear, left, initial encounter 07/03/2016  . Left knee pain 06/05/2016  . PCP NOTES >>>>>>>>>>>>>> 07/20/2015    Willette Alma, AGPCNP-BC Pager: [phone] Amion: Thea Alken

## 2019-09-02 ENCOUNTER — Ambulatory Visit: Payer: BC Managed Care – PPO | Admitting: Internal Medicine

## 2019-09-02 ENCOUNTER — Other Ambulatory Visit: Payer: Self-pay

## 2019-09-02 ENCOUNTER — Encounter: Payer: Self-pay | Admitting: Internal Medicine

## 2019-09-02 VITALS — BP 128/86 | HR 65 | Ht 69.0 in | Wt 301.0 lb

## 2019-09-02 DIAGNOSIS — I480 Paroxysmal atrial fibrillation: Secondary | ICD-10-CM

## 2019-09-02 DIAGNOSIS — I483 Typical atrial flutter: Secondary | ICD-10-CM

## 2019-09-02 DIAGNOSIS — R530 Neoplastic (malignant) related fatigue: Secondary | ICD-10-CM | POA: Diagnosis not present

## 2019-09-02 NOTE — Patient Instructions (Signed)
Medication Instructions:  Your physician recommends that you continue on your current medications as directed. Please refer to the Current Medication list given to you today.  Labwork: You will get lab work today:  TSH and CBC  Testing/Procedures: None ordered.  Follow-Up:  Follow up depending on results of lab work.  Any Other Special Instructions Will Be Listed Below (If Applicable).  If you need a refill on your cardiac medications before your next appointment, please call your pharmacy.

## 2019-09-02 NOTE — Progress Notes (Signed)
HPI Ms. Wulf returns today for followup. She is a pleasant 54 yo morbidly obese woman with a h/o atrial fib and flutter, who has been treated with catheter ablation and then flecainide. She has maintained NSR. She has occaisional palpitations. She c/o leg weakness and generalized fatigue.   No Known Allergies   Current Outpatient Medications  Medication Sig Dispense Refill   azithromycin (ZITHROMAX Z-PAK) 250 MG tablet 2 tabs a day the first day, then 1 tab a day x 4 days 6 tablet 0   ELIQUIS 5 MG TABS tablet TAKE 1 TABLET(5 MG) BY MOUTH TWICE DAILY 180 tablet 1   flecainide (TAMBOCOR) 150 MG tablet TAKE 1/2 TABLET(75 MG) BY MOUTH TWICE DAILY 90 tablet 3   HYDROcodone-homatropine (HYCODAN) 5-1.5 MG/5ML syrup Take 5 mLs by mouth 3 (three) times daily as needed for cough. 120 mL 0   metoprolol succinate (TOPROL-XL) 25 MG 24 hr tablet TAKE 1 AND 1/2 TABLETS BY MOUTH TWICE DAILY WITH A MEAL 270 tablet 3   predniSONE (DELTASONE) 10 MG tablet 2 tabs a day x 5 days 10 tablet 0   No current facility-administered medications for this visit.     Past Medical History:  Diagnosis Date   Abnormal uterine bleeding 07/2015   Acute meniscal tear, medial    left   H/O atrial flutter    Headache(784.0)     ROS:   All systems reviewed and negative except as noted in the HPI.   Past Surgical History:  Procedure Laterality Date   A-FLUTTER ABLATION N/A 09/20/2016   Procedure: A-Flutter Ablation;  Surgeon: Marinus Maw, MD;  Location: [facility];  Service: Cardiovascular;  Laterality: N/A;   APPENDECTOMY  at age 21   BACK SURGERY     CHOLECYSTECTOMY  ~2002   KNEE ARTHROSCOPY Left 07/27/2016   Procedure: LEFT KNEE ARTHROSCOPY WITH PARTIAL MEDIAL MENISCECTOMY;  Surgeon: Kathryne Hitch, MD;  Location: [facility];  Service: Orthopedics;  Laterality: Left;   KNEE SURGERY       Family History  Problem Relation Age of Onset   Arthritis Other    Diabetes  Mother    Melanoma Mother      Social History   Socioeconomic History   Marital status: Married    Spouse name: Not on file   Number of children: Not on file   Years of education: Not on file   Highest education level: Not on file  Occupational History   Not on file  Tobacco Use   Smoking status: Light Tobacco Smoker   Smokeless tobacco: Never Used  Vaping Use   Vaping Use: Never used  Substance and Sexual Activity   Alcohol use: Yes    Comment: occ   Drug use: No   Sexual activity: Yes    Birth control/protection: Condom  Other Topics Concern   Not on file  Social History Narrative   Not on file   Social Determinants of Health   Financial Resource Strain:    Difficulty of Paying Living Expenses:   Food Insecurity:    Worried About Programme researcher, broadcasting/film/video in the Last Year:    Barista in the Last Year:   Transportation Needs:    Freight forwarder (Medical):    Lack of Transportation (Non-Medical):   Physical Activity:    Days of Exercise per Week:    Minutes of Exercise per Session:   Stress:    Feeling of  Stress :   Social Connections:    Frequency of Communication with Friends and Family:    Frequency of Social Gatherings with Friends and Family:    Attends Religious Services:    Active Member of Clubs or Organizations:    Attends Engineer, structural:    Marital Status:   Intimate Partner Violence:    Fear of Current or Ex-Partner:    Emotionally Abused:    Physically Abused:    Sexually Abused:      BP 128/86    Pulse 65    Ht 5\' 9"  (1.753 m)    Wt (!) 301 lb (136.5 kg)    LMP  (LMP Unknown) Comment: irregular menses   SpO2 93%    BMI 44.45 kg/m   Physical Exam:  Well appearing NAD HEENT: Unremarkable Neck:  No JVD, no thyromegally Lymphatics:  No adenopathy Back:  No CVA tenderness Lungs:  Clear with no wheezes HEART:  Regular rate rhythm, no murmurs, no rubs, no clicks Abd:  soft, positive  bowel sounds, no organomegally, no rebound, no guarding Ext:  2 plus pulses, no edema, no cyanosis, no clubbing Skin:  No rashes no nodules Neuro:  CN II through XII intact, motor grossly intact  EKG - nsr  Assess/Plan: 1. Atrial fib/flutter - she seems to be controlled. Hopefully the flecainide she is taking will not be the cause of her other symptoms.  2. Severe fatigue weakness - we will check her thyroids. If normal, I will stop flecainide and switch to propafenone. 3. Obesity - she has been encouraged to lose weight.   .D.

## 2019-09-03 ENCOUNTER — Encounter: Payer: Self-pay | Admitting: Internal Medicine

## 2019-09-03 LAB — CBC WITH DIFFERENTIAL/PLATELET
Basophils Absolute: 0.1 10*3/uL (ref 0.0–0.2)
Basos: 1 %
EOS (ABSOLUTE): 0.4 10*3/uL (ref 0.0–0.4)
Eos: 4 %
Hematocrit: 40.7 % (ref 34.0–46.6)
Hemoglobin: 13.8 g/dL (ref 11.1–15.9)
Immature Grans (Abs): 0 10*3/uL (ref 0.0–0.1)
Immature Granulocytes: 0 %
Lymphocytes Absolute: 2.8 10*3/uL (ref 0.7–3.1)
Lymphs: 32 %
MCH: 31.4 pg (ref 26.6–33.0)
MCHC: 33.9 g/dL (ref 31.5–35.7)
MCV: 93 fL (ref 79–97)
Monocytes Absolute: 0.8 10*3/uL (ref 0.1–0.9)
Monocytes: 10 %
Neutrophils Absolute: 4.5 10*3/uL (ref 1.4–7.0)
Neutrophils: 53 %
Platelets: 347 10*3/uL (ref 150–450)
RBC: 4.4 x10E6/uL (ref 3.77–5.28)
RDW: 12.6 % (ref 11.7–15.4)
WBC: 8.5 10*3/uL (ref 3.4–10.8)

## 2019-09-03 LAB — TSH: TSH: 2.11 u[IU]/mL (ref 0.450–4.500)

## 2019-09-05 ENCOUNTER — Telehealth: Payer: Self-pay

## 2019-09-05 MED ORDER — PROPAFENONE HCL ER 325 MG PO CP12: 325.0000 mg | ORAL_CAPSULE | Freq: Two times a day (BID) | ORAL | 11 refills | Status: DC

## 2019-09-05 NOTE — Telephone Encounter (Signed)
-----   Message from Marinus Maw, MD sent at 09/03/2019 10:36 PM EDT ----- Her labs are normal including normal thyroid function. Ask her to stop flecainide for a week, and start propafenone 325 bid. 12 lead ECG in 10 days after starting propafenone. GT

## 2019-09-11 ENCOUNTER — Encounter: Payer: Self-pay | Admitting: Internal Medicine

## 2019-09-12 NOTE — Telephone Encounter (Signed)
Follow up ° ° °Patient is returning your call. Please call. ° ° ° °

## 2019-09-12 NOTE — Telephone Encounter (Signed)
LMTCB d/t Pt not reading her mychart message.

## 2019-09-12 NOTE — Telephone Encounter (Signed)
Patient returning call.

## 2019-09-15 NOTE — Telephone Encounter (Signed)
Sent MyChart message to Pt on 09/05/19 (per agreement and discussion with Pt).  Pt did not read mychart message so this nurse attempted to call Pt on 09/12/19 and left detailed message regarding medication change.  Spoke with Pt today.  She stopped her flecainide and took propafenone x 2 doses prior to getting this nurses message.    She then stopped flecainide and propafenone.  She will start propafenone on 09/19/2019.  Will plan for her to come to office for EKG on 10/02/19.

## 2019-10-02 ENCOUNTER — Ambulatory Visit (INDEPENDENT_AMBULATORY_CARE_PROVIDER_SITE_OTHER): Payer: BC Managed Care – PPO

## 2019-10-02 ENCOUNTER — Other Ambulatory Visit: Payer: Self-pay

## 2019-10-02 VITALS — HR 96

## 2019-10-02 DIAGNOSIS — I480 Paroxysmal atrial fibrillation: Secondary | ICD-10-CM | POA: Diagnosis not present

## 2019-10-02 DIAGNOSIS — I483 Typical atrial flutter: Secondary | ICD-10-CM | POA: Diagnosis not present

## 2019-10-02 NOTE — Progress Notes (Signed)
1.) Reason for visit: Medication change from flecainide to propafenone  2.) Name of MD requesting visit: Dr. Ladona Ridgel  3.) H&P: Pt with history of atrial flutter and afib.    4.) ROS related to problem: Per Pt-she requested to stop the flecainide because she had bilateral leg weakness after starting flecainide.  She also states that her left leg became swollen after starting flecainide.  Pt had to stop flecainide for 7 days before starting propafenone.  Per Pt during that time her leg weakness resolved and her left leg returned to normal.  After starting the propafenone she has noticed her left leg has started to swell again (this nurse cannot appreciate the difference between her legs).   Pt's in atrial flutter today.  Pt denies feeling bad.  States she normally cannot feel her arrhythmia until her heart rate gets fast.  5.) Assessment and plan per MD: EKG reviewed by DOD and Pt in aflutter today.  Will discuss medication changes with Dr. Ladona Ridgel.  Pt aware.

## 2019-10-06 ENCOUNTER — Telehealth: Payer: Self-pay

## 2019-10-06 NOTE — Telephone Encounter (Signed)
-----   Message from Wiliam Ke, RN sent at 10/02/2019  4:35 PM EDT ----- Need advice on medication vs change tx

## 2019-10-06 NOTE — Telephone Encounter (Signed)
Returned call to Pt.  Advised per Dr. Marshia Ly propafenone.  Schedule appt to discuss ablation.  Pt advised and in agreement.    Virtual visit scheduled to discuss ablation 8/17.

## 2019-10-21 ENCOUNTER — Other Ambulatory Visit: Payer: Self-pay

## 2019-10-21 ENCOUNTER — Telehealth (INDEPENDENT_AMBULATORY_CARE_PROVIDER_SITE_OTHER): Payer: BC Managed Care – PPO | Admitting: Internal Medicine

## 2019-10-21 ENCOUNTER — Encounter: Payer: Self-pay | Admitting: Internal Medicine

## 2019-10-21 VITALS — Ht 69.0 in | Wt 297.0 lb

## 2019-10-21 DIAGNOSIS — I480 Paroxysmal atrial fibrillation: Secondary | ICD-10-CM

## 2019-10-21 DIAGNOSIS — I483 Typical atrial flutter: Secondary | ICD-10-CM | POA: Diagnosis not present

## 2019-10-21 NOTE — Progress Notes (Signed)
Electrophysiology TeleHealth Note   Due to national recommendations of social distancing due to COVID 19, an audio/video telehealth visit is felt to be most appropriate for this patient at this time.  See MyChart message from today for the patient's consent to telehealth for [facility]   Date:  10/21/2019   ID:  Shelley Edwards, DOB 1966/01/21, MRN [id number]  Location: patient's home  Provider location: [address] [place]  Evaluation Performed: Follow-up visit  PCP:  Wanda Plump, MD  Cardiologist:  Lewayne Bunting, MD  Electrophysiologist:  Dr Ladona Ridgel  Chief Complaint:  " I am doing much better since I started on the new medicine."  History of Present Illness:    Shelley Edwards is a 54 y.o. female who presents via audio/video conferencing for a telehealth visit today.  Since last being seen in our clinic, the patient reports that she has been under increased stress. She had leg weakness on flecainide and we switched her to rhythmol and initially she had atrial flutter. She states that her symptoms have improved markedly after moving. She has recently divorced from her husband.   Today, she denies palpitations. No syncope.   Past Medical History:  Diagnosis Date   Abnormal uterine bleeding 07/2015   Acute meniscal tear, medial    left   H/O atrial flutter    Headache(784.0)     Past Surgical History:  Procedure Laterality Date   A-FLUTTER ABLATION N/A 09/20/2016   Procedure: A-Flutter Ablation;  Surgeon: Marinus Maw, MD;  [facility]: [facility];  Service: Cardiovascular;  Laterality: N/A;   APPENDECTOMY  at age 85   BACK SURGERY     CHOLECYSTECTOMY  ~2002   KNEE ARTHROSCOPY Left 07/27/2016   Procedure: LEFT KNEE ARTHROSCOPY WITH PARTIAL MEDIAL MENISCECTOMY;  Surgeon: Kathryne Hitch, MD;  Location: [facility];  Service: Orthopedics;  Laterality: Left;   KNEE SURGERY      Current Outpatient Medications  Medication Sig Dispense Refill    ELIQUIS 5 MG TABS tablet TAKE 1 TABLET(5 MG) BY MOUTH TWICE DAILY 180 tablet 1   metoprolol succinate (TOPROL-XL) 25 MG 24 hr tablet TAKE 1 AND 1/2 TABLETS BY MOUTH TWICE DAILY WITH A MEAL 270 tablet 3   propafenone (RYTHMOL SR) 325 MG 12 hr capsule Take 1 capsule (325 mg total) by mouth 2 (two) times daily. 60 capsule 11   No current facility-administered medications for this visit.    Allergies:   Patient has no known allergies.   Social History:  The patient  reports that she has been smoking. She has never used smokeless tobacco. She reports current alcohol use. She reports that she does not use drugs.   Family History:  The patient's family history includes Arthritis in an other family member; Diabetes in her mother; Melanoma in her mother.   ROS:  Please see the history of present illness.   All other systems are personally reviewed and negative.    Exam:    Vital Signs: none  Labs/Other Tests and Data Reviewed:    Recent Labs: 09/02/2019: Hemoglobin 13.8; Platelets 347; TSH 2.110   Wt Readings from Last 3 Encounters:  10/21/19 297 lb (134.7 kg)  09/02/19 (!) 301 lb (136.5 kg)  10/24/18 (!) 310 lb (140.6 kg)     Other studies personally reviewed: none  ASSESSMENT & PLAN:    1.  Atrial flutter - she has had recurrent typical flutter. However, her symptoms are much improved.  It sounds like she has reverted back to NSR and as long as she feels well, she will continue the rhythmol and undergo watchful waiting. If she has more symptomatic flutter, we will consider repeat catheter ablation.  2. Obesity - I have discussed the importance of weight loss in the past.    Dorathy Daft   COVID 19 screen The patient denies symptoms of COVID 19 at this time.  The importance of social distancing was discussed today.  Follow-up:  2 months Next remote: n/a  Current medicines are reviewed at length with the patient today.   The patient does not have concerns regarding her  medicines.  The following changes were made today:  none  Labs/ tests ordered today include: none No orders of the defined types were placed in this encounter.    Patient Risk:  after full review of this patients clinical status, I feel that they are at moderate risk at this time.  Today, I have spent 20 minutes with the patient with telehealth technology discussing all of above .    Signed, Lewayne Bunting, MD 1 10/21/2019 6:21 PM     [facility] [address] [place] [address] [phone] (office) [phone] (fax).

## 2019-11-11 ENCOUNTER — Other Ambulatory Visit: Payer: Self-pay | Admitting: Internal Medicine

## 2019-11-13 NOTE — Telephone Encounter (Signed)
Pt last saw Dr Ladona Ridgel 10/21/19 telemedicine, last labs 09/19/18 Creat 0.98, pt is overdue for labwork. Pt has follow-up appt on 12/23/18 with Dr Ladona Ridgel, note placed on appt pt NEEDS CBC and BMP f/u Eliquis.  Age 54, weight 134.7, based on specified criteria pt is on appropriate dosage of Eliquis 5mg  BID.  Will refill rx x 1 refill to get pt to appt for Ov and labwork with Dr .

## 2019-11-14 ENCOUNTER — Other Ambulatory Visit: Payer: Self-pay | Admitting: Internal Medicine

## 2019-11-14 NOTE — Telephone Encounter (Signed)
This rx was refill yesterday 11/13/19 for a 3 month supply 180 tablets.  Pt is overdue for labwork, but has an appt scheduled with Dr Ladona Ridgel 12/22/19.  Note placed on appt pt needs CBC and BMP prior to refills.  Will await OV and labwork for future refills.

## 2019-12-23 ENCOUNTER — Ambulatory Visit: Payer: BC Managed Care – PPO | Admitting: Internal Medicine

## 2020-01-21 ENCOUNTER — Other Ambulatory Visit: Payer: Self-pay

## 2020-01-21 ENCOUNTER — Encounter: Payer: Self-pay | Admitting: Internal Medicine

## 2020-01-21 ENCOUNTER — Ambulatory Visit: Payer: BC Managed Care – PPO | Admitting: Internal Medicine

## 2020-01-21 VITALS — BP 126/82 | HR 59 | Ht 69.0 in | Wt 292.6 lb

## 2020-01-21 DIAGNOSIS — I483 Typical atrial flutter: Secondary | ICD-10-CM | POA: Diagnosis not present

## 2020-01-21 DIAGNOSIS — I480 Paroxysmal atrial fibrillation: Secondary | ICD-10-CM | POA: Diagnosis not present

## 2020-01-21 LAB — CBC WITH DIFFERENTIAL/PLATELET
Basophils Absolute: 0.1 10*3/uL (ref 0.0–0.2)
Basos: 1 %
EOS (ABSOLUTE): 0.3 10*3/uL (ref 0.0–0.4)
Eos: 3 %
Hematocrit: 45 % (ref 34.0–46.6)
Hemoglobin: 15.5 g/dL (ref 11.1–15.9)
Immature Grans (Abs): 0 10*3/uL (ref 0.0–0.1)
Immature Granulocytes: 0 %
Lymphocytes Absolute: 2.4 10*3/uL (ref 0.7–3.1)
Lymphs: 28 %
MCH: 32.4 pg (ref 26.6–33.0)
MCHC: 34.4 g/dL (ref 31.5–35.7)
MCV: 94 fL (ref 79–97)
Monocytes Absolute: 0.7 10*3/uL (ref 0.1–0.9)
Monocytes: 9 %
Neutrophils Absolute: 4.9 10*3/uL (ref 1.4–7.0)
Neutrophils: 59 %
Platelets: 396 10*3/uL (ref 150–450)
RBC: 4.79 x10E6/uL (ref 3.77–5.28)
RDW: 13.2 % (ref 11.7–15.4)
WBC: 8.3 10*3/uL (ref 3.4–10.8)

## 2020-01-21 LAB — BASIC METABOLIC PANEL
BUN/Creatinine Ratio: 11 (ref 9–23)
BUN: 10 mg/dL (ref 6–24)
CO2: 24 mmol/L (ref 20–29)
Calcium: 9.4 mg/dL (ref 8.7–10.2)
Chloride: 105 mmol/L (ref 96–106)
Creatinine, Ser: 0.95 mg/dL (ref 0.57–1.00)
GFR calc Af Amer: 79 mL/min/{1.73_m2} (ref >59)
GFR calc non Af Amer: 69 mL/min/{1.73_m2} (ref >59)
Glucose: 109 mg/dL — ABNORMAL HIGH (ref 65–99)
Potassium: 4.8 mmol/L (ref 3.5–5.2)
Sodium: 139 mmol/L (ref 134–144)

## 2020-01-21 MED ORDER — APIXABAN 5 MG PO TABS: 5.0000 mg | ORAL_TABLET | Freq: Two times a day (BID) | ORAL | 3 refills | Status: DC

## 2020-01-21 NOTE — Progress Notes (Signed)
HPI Shelley Edwards returns today for followup. She is a pleasant obese 54 yo woman with Atrial fib and flutter who underwent catheter ablation remotely. She has had trouble with recurrent atrial fib and flutter, mostly flutter lately. She has gone back on her anti-arrhythmic drug therapy and appears to be maintaining NSR. No Known Allergies   Current Outpatient Medications  Medication Sig Dispense Refill  . apixaban (ELIQUIS) 5 MG TABS tablet Take 1 tablet (5 mg total) by mouth 2 (two) times daily. Overdue for labwork, MUST have labwork for Future refills 180 tablet 0  . metoprolol succinate (TOPROL-XL) 25 MG 24 hr tablet TAKE 1 AND 1/2 TABLETS BY MOUTH TWICE DAILY WITH A MEAL 270 tablet 3  . propafenone (RYTHMOL SR) 325 MG 12 hr capsule Take 1 capsule (325 mg total) by mouth 2 (two) times daily. 60 capsule 11   No current facility-administered medications for this visit.     Past Medical History:  Diagnosis Date  . Abnormal uterine bleeding 07/2015  . Acute meniscal tear, medial    left  . H/O atrial flutter   . Headache(784.0)     ROS:   All systems reviewed and negative except as noted in the HPI.   Past Surgical History:  Procedure Laterality Date  . A-FLUTTER ABLATION N/A 09/20/2016   Procedure: A-Flutter Ablation;  Surgeon: Marinus Maw, MD;  [facility]: [facility];  Service: Cardiovascular;  Laterality: N/A;  . APPENDECTOMY  at age 54  . BACK SURGERY    . CHOLECYSTECTOMY  ~2002  . KNEE ARTHROSCOPY Left 07/27/2016   Procedure: LEFT KNEE ARTHROSCOPY WITH PARTIAL MEDIAL MENISCECTOMY;  Surgeon: Kathryne Hitch, MD;  Location: [facility] OR;  Service: Orthopedics;  Laterality: Left;  . KNEE SURGERY       Family History  Problem Relation Age of Onset  . Arthritis Other   . Diabetes Mother   . Melanoma Mother      Social History   Socioeconomic History  . Marital status: Married    Spouse name: Not on file  . Number of children: Not on file  .  Years of education: Not on file  . Highest education level: Not on file  Occupational History  . Not on file  Tobacco Use  . Smoking status: Light Tobacco Smoker  . Smokeless tobacco: Never Used  Vaping Use  . Vaping Use: Never used  Substance and Sexual Activity  . Alcohol use: Yes    Comment: occ  . Drug use: No  . Sexual activity: Yes    Birth control/protection: Condom  Other Topics Concern  . Not on file  Social History Narrative  . Not on file   Social Determinants of Health   Financial Resource Strain:   . Difficulty of Paying Living Expenses: Not on file  Food Insecurity:   . Worried About Programme researcher, broadcasting/film/video in the Last Year: Not on file  . Ran Out of Food in the Last Year: Not on file  Transportation Needs:   . Lack of Transportation (Medical): Not on file  . Lack of Transportation (Non-Medical): Not on file  Physical Activity:   . Days of Exercise per Week: Not on file  . Minutes of Exercise per Session: Not on file  Stress:   . Feeling of Stress : Not on file  Social Connections:   . Frequency of Communication with Friends and Family: Not on file  . Frequency of Social Gatherings with  Friends and Family: Not on file  . Attends Religious Services: Not on file  . Active Member of Clubs or Organizations: Not on file  . Attends Banker Meetings: Not on file  . Marital Status: Not on file  Intimate Partner Violence:   . Fear of Current or Ex-Partner: Not on file  . Emotionally Abused: Not on file  . Physically Abused: Not on file  . Sexually Abused: Not on file     BP 126/82   Pulse (!) 59   Ht 5\' 9"  (1.753 m)   Wt 292 lb 9.6 oz (132.7 kg)   LMP  (LMP Unknown) Comment: irregular menses  SpO2 97%   BMI 43.21 kg/m   Physical Exam:  Well appearing NAD HEENT: Unremarkable Neck:  6 cm JVD, no thyromegally Lymphatics:  No adenopathy Back:  No CVA tenderness Lungs:  Clear with no wheezes HEART:  Regular rate rhythm, no murmurs, no rubs,  no clicks Abd:  soft, positive bowel sounds, no organomegally, no rebound, no guarding Ext:  2 plus pulses, no edema, no cyanosis, no clubbing Skin:  No rashes no nodules Neuro:  CN II through XII intact, motor grossly intact  EKG - NSR   Assess/Plan: 1. Atrial fib and flutter - she will continue propafenone. She appears to be asymptomatic.  2. Obesity - she has lost over 10 lbs. I encouraged her to lose more weight. We discussed fasting. 3. Coags - she has not had any bleeding. 4. HTN - her bp is controlled. No change in meds.  Margareth Kanner,MD

## 2020-01-21 NOTE — Patient Instructions (Addendum)
Medication Instructions:  Your physician recommends that you continue on your current medications as directed. Please refer to the Current Medication list given to you today.  Labwork: You will get lab work today: CBC and BMP  Testing/Procedures: None ordered.  Follow-Up: Your physician wants you to follow-up in: 6 months with Dr. Ladona Ridgel.   You will receive a reminder letter in the mail two months in advance. If you don't receive a letter, please call our office to schedule the follow-up appointment.   Any Other Special Instructions Will Be Listed Below (If Applicable).  If you need a refill on your cardiac medications before your next appointment, please call your pharmacy.

## 2020-05-14 ENCOUNTER — Encounter: Payer: Self-pay | Admitting: Internal Medicine

## 2020-08-09 ENCOUNTER — Telehealth: Payer: Self-pay

## 2020-08-09 NOTE — Telephone Encounter (Signed)
**Note De-Identified Xabi Wittler Obfuscation** I started a Eliquis PA through covermymeds. Key: [id number]

## 2020-09-09 ENCOUNTER — Other Ambulatory Visit: Payer: Self-pay

## 2020-09-09 ENCOUNTER — Encounter: Payer: Self-pay | Admitting: Internal Medicine

## 2020-09-09 ENCOUNTER — Ambulatory Visit (INDEPENDENT_AMBULATORY_CARE_PROVIDER_SITE_OTHER): Payer: BC Managed Care – PPO

## 2020-09-09 ENCOUNTER — Ambulatory Visit: Payer: BC Managed Care – PPO | Admitting: Internal Medicine

## 2020-09-09 VITALS — BP 144/80 | HR 96 | Ht 69.0 in | Wt 301.6 lb

## 2020-09-09 DIAGNOSIS — I480 Paroxysmal atrial fibrillation: Secondary | ICD-10-CM

## 2020-09-09 DIAGNOSIS — I483 Typical atrial flutter: Secondary | ICD-10-CM

## 2020-09-09 MED ORDER — APIXABAN 5 MG PO TABS
5.0000 mg | ORAL_TABLET | Freq: Two times a day (BID) | ORAL | 3 refills | Status: DC
Start: 2020-09-09 — End: 2021-02-18

## 2020-09-09 MED ORDER — METOPROLOL SUCCINATE ER 25 MG PO TB24
37.5000 mg | ORAL_TABLET | Freq: Every day | ORAL | 3 refills | Status: DC
Start: 2020-09-09 — End: 2020-11-23

## 2020-09-09 MED ORDER — PROPAFENONE HCL ER 325 MG PO CP12
325.0000 mg | ORAL_CAPSULE | Freq: Two times a day (BID) | ORAL | 3 refills | Status: DC
Start: 2020-09-09 — End: 2021-08-23

## 2020-09-09 NOTE — Patient Instructions (Addendum)
Medication Instructions:  Your physician recommends that you continue on your current medications as directed. Please refer to the Current Medication list given to you today.  Labwork: None ordered.  Testing/Procedures: Your physician has recommended that you wear a holter monitor. Holter monitors are medical devices that record the heart's electrical activity. Doctors most often use these monitors to diagnose arrhythmias. Arrhythmias are problems with the speed or rhythm of the heartbeat. The monitor is a small, portable device. You can wear one while you do your normal daily activities. This is usually used to diagnose what is causing palpitations/syncope (passing out).  You will wear a 3 day ZIO monitor  Follow-Up: Your physician wants you to follow-up in: one year with Lewayne Bunting, MD or one of the following Advanced Practice Providers on your designated Care Team:   Francis Dowse, [place] Casimiro Needle "Mardelle Matte" Lanna Poche, [place]   Any Other Special Instructions Will Be Listed Below (If Applicable).  If you need a refill on your cardiac medications before your next appointment, please call your pharmacy.   Your physician has recommended that you wear a Zio monitor.   This monitor is a medical device that records the heart's electrical activity. Doctors most often use these monitors to diagnose arrhythmias. Arrhythmias are problems with the speed or rhythm of the heartbeat. The monitor is a small device applied to your chest. You can wear one while you do your normal daily activities. While wearing this monitor if you have any symptoms to push the button and record what you felt. Once you have worn this monitor for the period of time provider prescribed (Usually 14 days), you will return the monitor device in the postage paid box. Once it is returned they will download the data collected and provide [place] with a report which the provider will then review and we will call you with those results. Important  tips:  Avoid showering during the first 24 hours of wearing the monitor. Avoid excessive sweating to help maximize wear time. Do not submerge the device, no hot tubs, and no swimming pools. Keep any lotions or oils away from the patch. After 24 hours you may shower with the patch on. Take brief showers with your back facing the shower head.  Do not remove patch once it has been placed because that will interrupt data and decrease adhesive wear time. Push the button when you have any symptoms and write down what you were feeling. Once you have completed wearing your monitor, remove and place into box which has postage paid and place in your outgoing mailbox.  If for some reason you have misplaced your box then call our office and we can provide another box and/or mail it off for you.

## 2020-09-09 NOTE — Progress Notes (Signed)
HPI Shelley Edwards returns today for followup. She is a pleasant obese 55 yo woman with Atrial fib and flutter who underwent catheter ablation remotely. She has had trouble with recurrent atrial fib and flutter, mostly flutter lately. She has gone back on her anti-arrhythmic drug therapy but has had more atrial flutter. She does not have palpitations and does not feel her flutter. She has had ongoing trouble with weight loss. She has gained 8 lbs since her last visit.  No Known Allergies   Current Outpatient Medications  Medication Sig Dispense Refill   apixaban (ELIQUIS) 5 MG TABS tablet Take 1 tablet (5 mg total) by mouth 2 (two) times daily. 180 tablet 3   metoprolol succinate (TOPROL-XL) 25 MG 24 hr tablet TAKE 1 AND 1/2 TABLETS BY MOUTH TWICE DAILY WITH A MEAL 270 tablet 3   propafenone (RYTHMOL SR) 325 MG 12 hr capsule Take 1 capsule (325 mg total) by mouth 2 (two) times daily. 60 capsule 11   No current facility-administered medications for this visit.     Past Medical History:  Diagnosis Date   Abnormal uterine bleeding 07/2015   Acute meniscal tear, medial    left   H/O atrial flutter    Headache(784.0)     ROS:   All systems reviewed and negative except as noted in the HPI.   Past Surgical History:  Procedure Laterality Date   A-FLUTTER ABLATION N/A 09/20/2016   Procedure: A-Flutter Ablation;  Surgeon: Marinus Maw, MD;  Location: [facility];  Service: Cardiovascular;  Laterality: N/A;   APPENDECTOMY  at age 66   BACK SURGERY     CHOLECYSTECTOMY  ~2002   KNEE ARTHROSCOPY Left 07/27/2016   Procedure: LEFT KNEE ARTHROSCOPY WITH PARTIAL MEDIAL MENISCECTOMY;  Surgeon: Kathryne Hitch, MD;  Location: [facility] OR;  Service: Orthopedics;  Laterality: Left;   KNEE SURGERY       Family History  Problem Relation Age of Onset   Arthritis Other    Diabetes Mother    Melanoma Mother      Social History   Socioeconomic History   Marital status:  Married    Spouse name: Not on file   Number of children: Not on file   Years of education: Not on file   Highest education level: Not on file  Occupational History   Not on file  Tobacco Use   Smoking status: Light Smoker    Pack years: 0.00   Smokeless tobacco: Never  Vaping Use   Vaping Use: Never used  Substance and Sexual Activity   Alcohol use: Yes    Comment: occ   Drug use: No   Sexual activity: Yes    Birth control/protection: Condom  Other Topics Concern   Not on file  Social History Narrative   Not on file   Social Determinants of Health   Financial Resource Strain: Not on file  Food Insecurity: Not on file  Transportation Needs: Not on file  Physical Activity: Not on file  Stress: Not on file  Social Connections: Not on file  Intimate Partner Violence: Not on file     BP (!) 144/80   Pulse 96   Ht 5\' 9"  (1.753 m)   Wt (!) 301 lb 9.6 oz (136.8 kg)   LMP  (LMP Unknown) Comment: irregular menses  SpO2 98%   BMI 44.54 kg/m   Physical Exam:  Well appearing NAD HEENT: Unremarkable Neck:  No JVD, no thyromegally  Lymphatics:  No adenopathy Back:  No CVA tenderness Lungs:  Clear HEART:  Regular rate rhythm, no murmurs, no rubs, no clicks Abd:  soft, positive bowel sounds, no organomegally, no rebound, no guarding Ext:  2 plus pulses, no edema, no cyanosis, no clubbing Skin:  No rashes no nodules Neuro:  CN II through XII intact, motor grossly intact  EKG - atrial flutter with a controlled VR  Assess/Plan:  Atrial flutter - her rates are controlled. She is asymptomatic but I remainconcerned about uncontrolled rates despite her beta blocker and rhythmol. She will wear a Zio monitor. Obesity - she has gained back the weight she lost. She is encouraged. Coags - no bleeding on eliquis. HTN - her blood pressure is fairly well controlled. Weight loss and a low sodium diet would help.   Shelley Gowda [name],MD

## 2020-09-09 NOTE — Progress Notes (Unsigned)
Patient enrolled for [employer] to mail a 3 day ZIO XT monitor to her address on file. 

## 2020-09-14 DIAGNOSIS — I480 Paroxysmal atrial fibrillation: Secondary | ICD-10-CM

## 2020-09-14 DIAGNOSIS — I483 Typical atrial flutter: Secondary | ICD-10-CM | POA: Diagnosis not present

## 2020-09-27 ENCOUNTER — Telehealth: Payer: Self-pay

## 2020-09-27 NOTE — Telephone Encounter (Signed)
Marinus Maw, MD  09/23/2020 10:04 PM EDT      Heart rate is reasonable. Encouraged weight loss.     Call placed to Pt and advised of results.  All questions answered.    Recall entered.

## 2020-11-23 ENCOUNTER — Other Ambulatory Visit: Payer: Self-pay | Admitting: Internal Medicine

## 2021-02-18 ENCOUNTER — Other Ambulatory Visit: Payer: Self-pay | Admitting: Internal Medicine

## 2021-02-18 DIAGNOSIS — I4891 Unspecified atrial fibrillation: Secondary | ICD-10-CM

## 2021-02-18 DIAGNOSIS — Z5181 Encounter for therapeutic drug level monitoring: Secondary | ICD-10-CM

## 2021-02-18 NOTE — Telephone Encounter (Signed)
Prescription refill request for Eliquis received. Indication: Afib  Last office visit:09/09/20 Ladona Ridgel)  Scr: 0.95 (01/21/20)  Age: 55 Weight: 136.8kg  Labs overdue. Pt stated she will run out of Eliquis over the weekend and is going out of town for [employer]. Lab appt scheduled for 03/14/21. Appropriate dose and refill sent to requested pharmacy (pt requested 90 day supply d/t insurance). Pt verbalized understanding concerning importance of lab appt on 03/14/21.

## 2021-03-14 ENCOUNTER — Other Ambulatory Visit: Payer: Self-pay

## 2021-03-14 ENCOUNTER — Other Ambulatory Visit: Payer: BC Managed Care – PPO | Admitting: *Deleted

## 2021-03-14 DIAGNOSIS — I4891 Unspecified atrial fibrillation: Secondary | ICD-10-CM

## 2021-03-14 DIAGNOSIS — Z5181 Encounter for therapeutic drug level monitoring: Secondary | ICD-10-CM

## 2021-03-15 LAB — CBC
Hematocrit: 44.3 % (ref 34.0–46.6)
Hemoglobin: 14.7 g/dL (ref 11.1–15.9)
MCH: 30.8 pg (ref 26.6–33.0)
MCHC: 33.2 g/dL (ref 31.5–35.7)
MCV: 93 fL (ref 79–97)
Platelets: 368 10*3/uL (ref 150–450)
RBC: 4.77 x10E6/uL (ref 3.77–5.28)
RDW: 12.3 % (ref 11.7–15.4)
WBC: 9.1 10*3/uL (ref 3.4–10.8)

## 2021-03-15 LAB — BASIC METABOLIC PANEL
BUN/Creatinine Ratio: 9 (ref 9–23)
BUN: 10 mg/dL (ref 6–24)
CO2: 25 mmol/L (ref 20–29)
Calcium: 9.5 mg/dL (ref 8.7–10.2)
Chloride: 99 mmol/L (ref 96–106)
Creatinine, Ser: 1.11 mg/dL — ABNORMAL HIGH (ref 0.57–1.00)
Glucose: 144 mg/dL — ABNORMAL HIGH (ref 70–99)
Potassium: 4.6 mmol/L (ref 3.5–5.2)
Sodium: 141 mmol/L (ref 134–144)
eGFR: 59 mL/min/{1.73_m2} — ABNORMAL LOW (ref >59)

## 2021-07-28 ENCOUNTER — Telehealth: Payer: Self-pay

## 2021-07-28 NOTE — Telephone Encounter (Signed)
**Note De-Identified Shelley Edwards Obfuscation** Janace Aris Key: [id number] - PA Case ID: [id number] Outcome: This request has been approved using information available on the patient's profile. Prior Auth;Coverage Start Date:06/28/2021;Coverage End Date:07/28/2022 Drug: Eliquis 5MG  tablets Form: [employer] Electronic PA Form (2017 NCPDP)  I have notified [id number] - [place], [place] - [address] (Ph: [phone]) of this approval.

## 2021-08-22 ENCOUNTER — Other Ambulatory Visit: Payer: Self-pay | Admitting: Internal Medicine

## 2021-08-29 ENCOUNTER — Other Ambulatory Visit: Payer: Self-pay | Admitting: Internal Medicine

## 2021-08-31 ENCOUNTER — Other Ambulatory Visit: Payer: Self-pay | Admitting: Internal Medicine

## 2021-10-26 ENCOUNTER — Other Ambulatory Visit: Payer: Self-pay | Admitting: Internal Medicine

## 2021-10-26 DIAGNOSIS — I480 Paroxysmal atrial fibrillation: Secondary | ICD-10-CM

## 2021-10-26 NOTE — Telephone Encounter (Signed)
Eliquis 5mg  refill request received. Patient is 56 years old, weight-136.8kg, Crea-1.11 on 03/14/2021, Diagnosis-Afib/glutter, and last seen by Dr 05/12/2021 on 09/09/2020-pt needs an appt. Dose is appropriate based on dosing criteria.   Will send a message to schedulers to reach out for an appt since she is overdue.

## 2021-10-27 NOTE — Telephone Encounter (Signed)
Pt has scheduled appt with Dr Ladona Ridgel on 01/10/22 with Dr Ladona Ridgel. Appropriate dose and refill sent to requested pharmacy to last until upcoming appt.

## 2021-12-05 ENCOUNTER — Other Ambulatory Visit: Payer: Self-pay | Admitting: Internal Medicine

## 2021-12-07 ENCOUNTER — Other Ambulatory Visit: Payer: Self-pay | Admitting: Internal Medicine

## 2021-12-28 ENCOUNTER — Other Ambulatory Visit: Payer: Self-pay | Admitting: Internal Medicine

## 2022-01-10 ENCOUNTER — Ambulatory Visit: Payer: BC Managed Care – PPO | Attending: Internal Medicine | Admitting: Internal Medicine

## 2022-01-10 ENCOUNTER — Encounter: Payer: Self-pay | Admitting: Internal Medicine

## 2022-01-10 VITALS — BP 126/78 | HR 66 | Ht 69.0 in | Wt 305.0 lb

## 2022-01-10 DIAGNOSIS — I480 Paroxysmal atrial fibrillation: Secondary | ICD-10-CM

## 2022-01-10 DIAGNOSIS — I10 Essential (primary) hypertension: Secondary | ICD-10-CM | POA: Diagnosis not present

## 2022-01-10 MED ORDER — APIXABAN 5 MG PO TABS: ORAL_TABLET | ORAL | 3 refills | Status: DC

## 2022-01-10 MED ORDER — PROPAFENONE HCL ER 325 MG PO CP12: ORAL_CAPSULE | ORAL | 3 refills | Status: DC

## 2022-01-10 MED ORDER — METOPROLOL SUCCINATE ER 25 MG PO TB24: ORAL_TABLET | ORAL | 3 refills | Status: DC

## 2022-01-10 NOTE — Progress Notes (Signed)
HPI Shelley Edwards returns today for followup. She is a pleasant obese 56 yo woman with Atrial fib and flutter who underwent catheter ablation remotely. She has had trouble with recurrent atrial fib and flutter, mostly flutter lately. She has gone back on her anti-arrhythmic drug therapy but has had more atrial flutter. She does not have palpitations and does not feel her flutter. She has had ongoing trouble with weight loss. She has gained 4 lbs since her last visit.  She is interested in considering Ozempic. No Known Allergies   Current Outpatient Medications  Medication Sig Dispense Refill   apixaban (ELIQUIS) 5 MG TABS tablet TAKE 1 TABLET(5 MG) BY MOUTH TWICE DAILY 180 tablet 0   metoprolol succinate (TOPROL-XL) 25 MG 24 hr tablet TAKE 1 AND 1/2 TABLETS BY MOUTH TWICE DAILY WITH A MEAL. 90 tablet 0   propafenone (RYTHMOL SR) 325 MG 12 hr capsule TAKE 1 CAPSULE(325 MG) BY MOUTH TWICE DAILY 60 capsule 0   No current facility-administered medications for this visit.     Past Medical History:  Diagnosis Date   Abnormal uterine bleeding 07/2015   Acute meniscal tear, medial    left   H/O atrial flutter    Headache(784.0)     ROS:   All systems reviewed and negative except as noted in the HPI.   Past Surgical History:  Procedure Laterality Date   A-FLUTTER ABLATION N/A 09/20/2016   Procedure: A-Flutter Ablation;  Surgeon: Evans Lance, MD;  Location: [place] [facility];  Service: Cardiovascular;  Laterality: N/A;   APPENDECTOMY  at age 25   BACK SURGERY     CHOLECYSTECTOMY  ~2002   KNEE ARTHROSCOPY Left 07/27/2016   Procedure: LEFT KNEE ARTHROSCOPY WITH PARTIAL MEDIAL MENISCECTOMY;  Surgeon: Mcarthur Rossetti, MD;  Location: [facility];  Service: Orthopedics;  Laterality: Left;   KNEE SURGERY       Family History  Problem Relation Age of Onset   Arthritis Other    Diabetes Mother    Melanoma Mother      Social History   Socioeconomic History   Marital  status: Married    Spouse name: Not on file   Number of children: Not on file   Years of education: Not on file   Highest education level: Not on file  Occupational History   Not on file  Tobacco Use   Smoking status: Light Smoker   Smokeless tobacco: Never  Vaping Use   Vaping Use: Never used  Substance and Sexual Activity   Alcohol use: Yes    Comment: occ   Drug use: No   Sexual activity: Yes    Birth control/protection: Condom  Other Topics Concern   Not on file  Social History Narrative   Not on file   Social Determinants of Health   Financial Resource Strain: Not on file  Food Insecurity: Not on file  Transportation Needs: Not on file  Physical Activity: Not on file  Stress: Not on file  Social Connections: Not on file  Intimate Partner Violence: Not on file     BP 126/78   Pulse 66   Ht 5\' 9"  (1.753 m)   Wt (!) 305 lb (138.3 kg)   LMP  (LMP Unknown) Comment: irregular menses  SpO2 98%   BMI 45.04 kg/m   Physical Exam:  Well appearing NAD HEENT: Unremarkable Neck:  No JVD, no thyromegally Lymphatics:  No adenopathy Back:  No CVA tenderness Lungs:  Clear HEART:  Regular rate rhythm, no murmurs, no rubs, no clicks Abd:  soft, positive bowel sounds, no organomegally, no rebound, no guarding Ext:  2 plus pulses, no edema, no cyanosis, no clubbing Skin:  No rashes no nodules Neuro:  CN II through XII intact, motor grossly intact  EKG - atrial fib/flutter with a RVR  Assess/Plan: Atrial fib/flutter - I have recommended continued medical therapy. Her rate is not well controlled today but has been on her current medical therapy.  Obesity - I will refer her back to her primary MD to consider Ozempic. Coags - she has not had any bleeding on Elqiusi. HTN - her bp is minimally elevated despite her obesity. Continue toprol.  Shelley Gowda Darenda Fike,MD

## 2022-01-10 NOTE — Patient Instructions (Addendum)
Medication Instructions:  Your physician has recommended you make the following change in your medication: Your Metoprolol, Eliquis, and Rythmol prescriptions have all been refilled and sent to your pharmacy.     **Please send note to Dr. Kathlene November regarding patients plan of care.**     Lab Work: None ordered.  If you have labs (blood work) drawn today and your tests are completely normal, you will receive your results only by: [place] (if you have MyChart) OR A paper copy in the mail If you have any lab test that is abnormal or we need to change your treatment, we will call you to review the results.  Testing/Procedures: None ordered.  Follow-Up: At [facility], you and your health needs are our priority.  As part of our continuing mission to provide you with exceptional heart care, we have created designated Provider Care Teams.  These Care Teams include your primary Cardiologist (physician) and Advanced Practice Providers (APPs -  Physician Assistants and Nurse Practitioners) who all work together to provide you with the care you need, when you need it.  We recommend signing up for the patient portal called "MyChart".  Sign up information is provided on this After Visit Summary.  MyChart is used to connect with patients for Virtual Visits (Telemedicine).  Patients are able to view lab/test results, encounter notes, upcoming appointments, etc.  Non-urgent messages can be sent to your provider as well.   To learn more about what you can do with MyChart, go to [email or web address].    Your next appointment:   1 year(s)  The format for your next appointment:   In Person  Provider:   Cristopher Peru, MD{or one of the following Advanced Practice Providers on your designated Care Team:   Tommye Standard, [place] Legrand Como "Jonni Sanger" Chalmers Cater, [place]   Important Information About Sugar

## 2022-01-13 ENCOUNTER — Ambulatory Visit: Payer: BC Managed Care – PPO | Admitting: Internal Medicine

## 2022-01-13 ENCOUNTER — Encounter: Payer: Self-pay | Admitting: Internal Medicine

## 2022-01-13 NOTE — Progress Notes (Unsigned)
Subjective:    Patient ID: Shelley Edwards, female    DOB: 1965/09/20, 56 y.o.   MRN: [id number]  DOS:  01/13/2022 Type of visit - description: Here as advised by cardiology for weight management  Here for morbid obesity management. The patient reports that since she had a ablation in 2018 it has been more difficult to manage her weight. The last time she started a effort to lose weight was 10-2021 approximately. Reports that she has lost approximately 2 to 3 pounds. She feels disappointed about the outcome of her diet.   Review of Systems Reports no blurred vision, unclear if she has polydipsia reports she "drinks water all the time". Does not feel particularly hungry but she has a lot of cravings for carbohydrates  Past Medical History:  Diagnosis Date   Abnormal uterine bleeding 07/2015   Acute meniscal tear, medial    left   H/O atrial flutter    Headache(784.0)     Past Surgical History:  Procedure Laterality Date   A-FLUTTER ABLATION N/A 09/20/2016   Procedure: A-Flutter Ablation;  Surgeon: Marinus Maw, MD;  Location: [facility];  Service: Cardiovascular;  Laterality: N/A;   APPENDECTOMY  at age 56   BACK SURGERY     CHOLECYSTECTOMY  ~2002   KNEE ARTHROSCOPY Left 07/27/2016   Procedure: LEFT KNEE ARTHROSCOPY WITH PARTIAL MEDIAL MENISCECTOMY;  Surgeon: Kathryne Hitch, MD;  Location: [facility] OR;  Service: Orthopedics;  Laterality: Left;   KNEE SURGERY      Current Outpatient Medications  Medication Instructions   apixaban (ELIQUIS) 5 MG TABS tablet TAKE 1 TABLET(5 MG) BY MOUTH TWICE DAILY   metoprolol succinate (TOPROL-XL) 25 MG 24 hr tablet TAKE 1 AND 1/2 TABLETS BY MOUTH TWICE DAILY WITH A MEAL.   propafenone (RYTHMOL SR) 325 MG 12 hr capsule TAKE 1 CAPSULE(325 MG) BY MOUTH TWICE DAILY       Objective:   Physical Exam BP 126/84   Pulse 73   Temp 98.2 F (36.8 C) (Oral)   Resp 18   Ht 5\' 9"  (1.753 m)   Wt (!) 303 lb 8 oz (137.7 kg)   LMP  (LMP  Unknown) Comment: irregular menses  SpO2 97%   BMI 44.82 kg/m  General:   Well developed, NAD, BMI noted. HEENT:  Normocephalic . Face symmetric, atraumatic Lungs:  CTA B Normal respiratory effort, no intercostal retractions, no accessory muscle use. Heart: RRR,  no murmur.  Lower extremities: no pretibial edema bilaterally  Skin: Not pale. Not jaundice Neurologic:  alert & oriented X3.  Speech normal, gait appropriate for age and unassisted Psych--  Cognition and judgment appear intact.  Cooperative with normal attention span and concentration.  Behavior appropriate. No anxious or depressed appearing.      Assessment    ASSESSMENT P- A fib, ablation 09/2016  Pre monopausal , LMP ~ 07-2015 Morbid obesity  PLAN Morbid obesity: Long history of obesity, getting worse, chart is reviewed, Labs from 03-2021: CBG 144, creatinine 1.1, CBC normal.  No recent A1c. Last TSH 2021 Weight 2018 285 pounds Weight 2022: 301 pounds  Weight today 303 pounds My recommendation: Calorie counting for 2 weeks to determine objectively how much she eats,  after that, start to reduce calorie intake. She is unable to exercise. Checking labs, she qualifies for weight management injections however they are scarce and I am not sure if she can get them.  We may need to wait for few months.  If  she has developed diabetes then she will have other options Labs today: CMP FLP CBC TSH A1c vitamin D Preventive care: Offered vaccines, declined RTC 2 to 3 months

## 2022-01-13 NOTE — Patient Instructions (Signed)
Recommend to do calorie counting for the next 2 weeks to determine your food intake  Recommend to follow a structured diet such as Noom, weight watchers or Golo  GO TO THE LAB : Get the blood work     GO TO THE FRONT DESK, PLEASE SCHEDULE YOUR APPOINTMENTS Come back for   checkup in 2 to 3 months

## 2022-01-14 LAB — HEMOGLOBIN A1C
Hgb A1c MFr Bld: 6.4 % of total Hgb — ABNORMAL HIGH (ref <5.7)
Mean Plasma Glucose: 137 mg/dL
eAG (mmol/L): 7.6 mmol/L

## 2022-01-14 LAB — COMPREHENSIVE METABOLIC PANEL
AG Ratio: 1.5 (calc) (ref 1.0–2.5)
ALT: 24 U/L (ref 6–29)
AST: 21 U/L (ref 10–35)
Albumin: 4 g/dL (ref 3.6–5.1)
Alkaline phosphatase (APISO): 73 U/L (ref 37–153)
BUN/Creatinine Ratio: 13 (calc) (ref 6–22)
BUN: 15 mg/dL (ref 7–25)
CO2: 26 mmol/L (ref 20–32)
Calcium: 9.6 mg/dL (ref 8.6–10.4)
Chloride: 103 mmol/L (ref 98–110)
Creat: 1.15 mg/dL — ABNORMAL HIGH (ref 0.50–1.03)
Globulin: 2.6 g/dL (calc) (ref 1.9–3.7)
Glucose, Bld: 124 mg/dL — ABNORMAL HIGH (ref 65–99)
Potassium: 4.7 mmol/L (ref 3.5–5.3)
Sodium: 137 mmol/L (ref 135–146)
Total Bilirubin: 0.4 mg/dL (ref 0.2–1.2)
Total Protein: 6.6 g/dL (ref 6.1–8.1)

## 2022-01-14 LAB — CBC WITH DIFFERENTIAL/PLATELET
Absolute Monocytes: 749 cells/uL (ref 200–950)
Basophils Absolute: 62 cells/uL (ref 0–200)
Basophils Relative: 0.8 %
Eosinophils Absolute: 226 cells/uL (ref 15–500)
Eosinophils Relative: 2.9 %
HCT: 44.2 % (ref 35.0–45.0)
Hemoglobin: 15.1 g/dL (ref 11.7–15.5)
Lymphs Abs: 2332 cells/uL (ref 850–3900)
MCH: 31.7 pg (ref 27.0–33.0)
MCHC: 34.2 g/dL (ref 32.0–36.0)
MCV: 92.7 fL (ref 80.0–100.0)
MPV: 10.2 fL (ref 7.5–12.5)
Monocytes Relative: 9.6 %
Neutro Abs: 4430 cells/uL (ref 1500–7800)
Neutrophils Relative %: 56.8 %
Platelets: 375 10*3/uL (ref 140–400)
RBC: 4.77 10*6/uL (ref 3.80–5.10)
RDW: 12.7 % (ref 11.0–15.0)
Total Lymphocyte: 29.9 %
WBC: 7.8 10*3/uL (ref 3.8–10.8)

## 2022-01-14 LAB — TSH: TSH: 1.94 mIU/L

## 2022-01-14 LAB — LIPID PANEL
Cholesterol: 309 mg/dL — ABNORMAL HIGH (ref <200)
HDL: 44 mg/dL — ABNORMAL LOW (ref >=50)
LDL Cholesterol (Calc): 205 mg/dL (calc) — ABNORMAL HIGH
Non-HDL Cholesterol (Calc): 265 mg/dL (calc) — ABNORMAL HIGH (ref <130)
Total CHOL/HDL Ratio: 7 (calc) — ABNORMAL HIGH (ref <5.0)
Triglycerides: 400 mg/dL — ABNORMAL HIGH (ref <150)

## 2022-01-14 LAB — VITAMIN D 25 HYDROXY (VIT D DEFICIENCY, FRACTURES): Vit D, 25-Hydroxy: 22 ng/mL — ABNORMAL LOW (ref 30–100)

## 2022-01-15 NOTE — Assessment & Plan Note (Signed)
Morbid obesity: Long history of obesity, getting worse, chart is reviewed, Labs from 03-2021: CBG 144, creatinine 1.1, CBC normal.  No recent A1c. Last TSH 2021 Weight 2018 285 pounds Weight 2022: 301 pounds  Weight today 303 pounds My recommendation: Calorie counting for 2 weeks to determine objectively how much she eats,  after that, start to reduce calorie intake. She is unable to exercise. Checking labs, she qualifies for weight management injections however they are scarce and I am not sure if she can get them.  We may need to wait for few months.  If she has developed diabetes then she will have other options Labs today: CMP FLP CBC TSH A1c vitamin D Preventive care: Offered vaccines, declined RTC 2 to 3 months

## 2022-01-17 ENCOUNTER — Telehealth: Payer: Self-pay | Admitting: Internal Medicine

## 2022-01-17 NOTE — Telephone Encounter (Signed)
LMOM asking for call back.  

## 2022-01-17 NOTE — Telephone Encounter (Signed)
Call patient: -Cholesterol is very high, recommend atorvastatin 20 mg daily, we will start with a low-dose titrate up if needed. -She has prediabetes, recommend metformin 500 mg 1 tablet daily x1 week then 1 tablet twice daily with meals.  Metformin does have a  modest effect on weight loss.  She may benefit from it. -Once other medications are available such as Wegovy or Mounjaro we can proceed with dose drugs.   Recommend to reach out to [place] in January to see availability. - Vitamin D is low, recommend ergocalciferol 50,000 units weekly, 91-month supply

## 2022-01-19 ENCOUNTER — Encounter: Payer: Self-pay | Admitting: Internal Medicine

## 2022-01-19 MED ORDER — VITAMIN D (ERGOCALCIFEROL) 1.25 MG (50000 UNIT) PO CAPS: 50000.0000 [IU] | ORAL_CAPSULE | ORAL | 0 refills | Status: DC

## 2022-01-19 NOTE — Telephone Encounter (Signed)
Letter mailed to Pt asking for call back regarding labs.

## 2022-01-19 NOTE — Telephone Encounter (Signed)
See my chart message

## 2022-04-08 ENCOUNTER — Other Ambulatory Visit: Payer: Self-pay | Admitting: Internal Medicine

## 2022-04-19 ENCOUNTER — Encounter: Payer: BC Managed Care – PPO | Admitting: Internal Medicine

## 2022-05-02 ENCOUNTER — Encounter: Payer: Self-pay | Admitting: Internal Medicine

## 2022-05-02 ENCOUNTER — Ambulatory Visit (INDEPENDENT_AMBULATORY_CARE_PROVIDER_SITE_OTHER): Payer: BC Managed Care – PPO | Admitting: Internal Medicine

## 2022-05-02 VITALS — BP 138/86 | HR 94 | Temp 97.9°F | Resp 18 | Ht 69.0 in | Wt 283.4 lb

## 2022-05-02 DIAGNOSIS — E785 Hyperlipidemia, unspecified: Secondary | ICD-10-CM

## 2022-05-02 DIAGNOSIS — I480 Paroxysmal atrial fibrillation: Secondary | ICD-10-CM | POA: Diagnosis not present

## 2022-05-02 DIAGNOSIS — I10 Essential (primary) hypertension: Secondary | ICD-10-CM

## 2022-05-02 DIAGNOSIS — Z0000 Encounter for general adult medical examination without abnormal findings: Secondary | ICD-10-CM

## 2022-05-02 MED ORDER — METOPROLOL SUCCINATE ER 100 MG PO TB24: 100.0000 mg | ORAL_TABLET | Freq: Two times a day (BID) | ORAL | 0 refills | Status: DC

## 2022-05-02 NOTE — Patient Instructions (Addendum)
You are doing great with your diet.  Congratulations.  return the stool test at your convenience  Please consider getting a pap smear, mammogram . Early detection of cancer is KEY!   Please increase metoprolol 100 mg twice daily.  We are referring you to the cardiologist, your heart rate is very fast, this can have long-term consequences.  Check the  blood pressure regularly BP GOAL is between 110/65 and  135/85. If it is consistently higher or lower, let me know  Start taking vitamin D over-the-counter: 2000 units daily   GO TO THE LAB : Get the blood work     [place], [place] back for a checkup in 4 months

## 2022-05-02 NOTE — Progress Notes (Unsigned)
Subjective:    Patient ID: Shelley Edwards, female    DOB: Jul 25, 1965, 57 y.o.   MRN: [id number]  DOS:  05/02/2022 Type of visit - description: CPX  Here for CPX, reportedly she feels great.  Wt Readings from Last 3 Encounters:  05/02/22 283 lb 6 oz (128.5 kg)  01/13/22 (!) 303 lb 8 oz (137.7 kg)  01/10/22 (!) 305 lb (138.3 kg)    Review of Systems  A 14 point review of systems is negative    Past Medical History:  Diagnosis Date   Abnormal uterine bleeding 07/2015   Acute meniscal tear, medial    left   H/O atrial flutter    Headache(784.0)     Past Surgical History:  Procedure Laterality Date   A-FLUTTER ABLATION N/A 09/20/2016   Procedure: A-Flutter Ablation;  Surgeon: Evans Lance, MD;  Location: [place] [facility];  Service: Cardiovascular;  Laterality: N/A;   APPENDECTOMY  at age 12   BACK SURGERY     CHOLECYSTECTOMY  ~2002   KNEE ARTHROSCOPY Left 07/27/2016   Procedure: LEFT KNEE ARTHROSCOPY WITH PARTIAL MEDIAL MENISCECTOMY;  Surgeon: Mcarthur Rossetti, MD;  Location: [place];  Service: Orthopedics;  Laterality: Left;   KNEE SURGERY     Social History   Socioeconomic History   Marital status: Divorced    Spouse name: Not on file   Number of children: 2   Years of education: Not on file   Highest education level: Not on file  Occupational History   Occupation: [employer], travels a lot  Tobacco Use   Smoking status: Light Smoker   Smokeless tobacco: Never   Tobacco comments:    Occ tobacco  Vaping Use   Vaping Use: Never used  Substance and Sexual Activity   Alcohol use: Yes    Comment: occ   Drug use: No   Sexual activity: Yes    Birth control/protection: Condom  Other Topics Concern   Not on file  Social History Narrative   Divorced ~ 2021   Lives by herself    Social Determinants of Health   Financial Resource Strain: Not on file  Food Insecurity: Not on file  Transportation Needs: Not on file  Physical Activity: Not on file  Stress:  Not on file  Social Connections: Not on file  Intimate Partner Violence: Not on file    Current Outpatient Medications  Medication Instructions   apixaban (ELIQUIS) 5 MG TABS tablet TAKE 1 TABLET(5 MG) BY MOUTH TWICE DAILY   metoprolol succinate (TOPROL-XL) 100 mg, Oral, 2 times daily, Take with or immediately following a meal.   propafenone (RYTHMOL SR) 325 MG 12 hr capsule TAKE 1 CAPSULE(325 MG) BY MOUTH TWICE DAILY       Objective:   Physical Exam BP 138/86   Pulse 94   Temp 97.9 F (36.6 C) (Oral)   Resp 18   Ht '5\' 9"'$  (1.753 m)   Wt 283 lb 6 oz (128.5 kg)   LMP  (LMP Unknown) Comment: irregular menses  SpO2 96%   BMI 41.85 kg/m  General: Well developed, NAD, BMI noted Neck: No  thyromegaly  HEENT:  Normocephalic . Face symmetric, atraumatic Lungs:  CTA B Normal respiratory effort, no intercostal retractions, no accessory muscle use. Heart: Tachycardic, heart rate approximately 120.  Abdomen:  Not distended, soft, non-tender. No rebound or rigidity.   Lower extremities: no pretibial edema bilaterally  Skin: Exposed areas without rash. Not pale. Not jaundice Neurologic:  alert &  oriented X3.  Speech normal, gait appropriate for age and unassisted Strength symmetric and appropriate for age.  Psych: Cognition and judgment appear intact.  Cooperative with normal attention span and concentration.  Behavior appropriate. No anxious or depressed appearing.     Assessment     ASSESSMENT P- A fib, ablation 09/2016  Pre monopausal , LMP ~ 07-2015 Morbid obesity  PLAN Here for CPX Atrial fibrillation: Good compliance with Eliquis, metoprolol, heart rate today 120, saw cardiology few months ago EKG at the time showed A flutter with a heart rate of 133.   plan: Increase metoprolol xr from 75 mg bid to 100 mg bid.  Refer to cardiology for further management.   Hyperglycemia: Last A1c 6.4, recommended metformin, declined , she is doing great with her diet, + weight loss.   Recheck A1c High cholesterol: Last LDL 205, was recommended atorvastatin, she is not taking it.  Recheck FLP. Morbid obesity: Started very low carbohydrate diet 45 days ago, has lost 20 pounds. Praised! Vitamin D deficiency, was Rx ergocalciferol few months ago.  Recommend OTC vitamin D. RTC 4 months

## 2022-05-03 ENCOUNTER — Encounter: Payer: Self-pay | Admitting: Internal Medicine

## 2022-05-03 LAB — LDL CHOLESTEROL, DIRECT: Direct LDL: 198 mg/dL

## 2022-05-03 LAB — HEMOGLOBIN A1C: Hgb A1c MFr Bld: 6.1 % (ref 4.6–6.5)

## 2022-05-03 LAB — LIPID PANEL
Cholesterol: 274 mg/dL — ABNORMAL HIGH (ref 0–200)
HDL: 43.9 mg/dL (ref >39.00)
NonHDL: 230.14
Total CHOL/HDL Ratio: 6
Triglycerides: 267 mg/dL — ABNORMAL HIGH (ref 0.0–149.0)
VLDL: 53.4 mg/dL — ABNORMAL HIGH (ref 0.0–40.0)

## 2022-05-03 LAB — COMPREHENSIVE METABOLIC PANEL
ALT: 25 U/L (ref 0–35)
AST: 24 U/L (ref 0–37)
Albumin: 3.8 g/dL (ref 3.5–5.2)
Alkaline Phosphatase: 76 U/L (ref 39–117)
BUN: 9 mg/dL (ref 6–23)
CO2: 25 mEq/L (ref 19–32)
Calcium: 9.5 mg/dL (ref 8.4–10.5)
Chloride: 102 mEq/L (ref 96–112)
Creatinine, Ser: 1.19 mg/dL (ref 0.40–1.20)
GFR: 51.34 mL/min — ABNORMAL LOW (ref >60.00)
Glucose, Bld: 111 mg/dL — ABNORMAL HIGH (ref 70–99)
Potassium: 4.3 mEq/L (ref 3.5–5.1)
Sodium: 137 mEq/L (ref 135–145)
Total Bilirubin: 0.5 mg/dL (ref 0.2–1.2)
Total Protein: 6.6 g/dL (ref 6.0–8.3)

## 2022-05-03 NOTE — Assessment & Plan Note (Signed)
Here for CPX Atrial fibrillation: Good compliance with Eliquis, metoprolol, heart rate today 120, saw cardiology few months ago EKG at the time showed A flutter with a heart rate of 133.   plan: Increase metoprolol xr from 75 mg bid to 100 mg bid.  Refer to cardiology for further management.   Hyperglycemia: Last A1c 6.4, recommended metformin, declined , she is doing great with her diet, + weight loss.  Recheck A1c High cholesterol: Last LDL 205, was recommended atorvastatin, she is not taking it.  Recheck FLP. Morbid obesity: Started very low carbohydrate diet 45 days ago, has lost 20 pounds. Praised! Vitamin D deficiency, was Rx ergocalciferol few months ago.  Recommend OTC vitamin D. RTC 4 months

## 2022-05-03 NOTE — Assessment & Plan Note (Signed)
Vaccines: Declines all Female care: MMGs: had one  in her life time, states she WILL not pursue  a MMG. Gyn eval: years ago offered a referral, decline. CCS: 3 options d/w pt, elected IFOB Labs: FLP A1c Patient education:  Explained that the role of vaccines on preventing  very serious infections, role for screening to catch potential cancer early be treated successfully.

## 2022-05-04 MED ORDER — ATORVASTATIN CALCIUM 20 MG PO TABS: 20.0000 mg | ORAL_TABLET | Freq: Every day | ORAL | 0 refills | Status: DC

## 2022-05-04 NOTE — Addendum Note (Signed)
Addended byDamita Dunnings D on: 05/04/2022 02:04 PM   Modules accepted: Orders

## 2022-05-07 ENCOUNTER — Other Ambulatory Visit: Payer: Self-pay | Admitting: Internal Medicine

## 2022-05-11 ENCOUNTER — Encounter: Payer: Self-pay | Admitting: Internal Medicine

## 2022-06-16 ENCOUNTER — Encounter: Payer: Self-pay | Admitting: Internal Medicine

## 2022-09-19 ENCOUNTER — Telehealth: Payer: Self-pay | Admitting: Internal Medicine

## 2022-09-19 NOTE — Telephone Encounter (Signed)
 Left message for Pt to call back.

## 2022-09-19 NOTE — Telephone Encounter (Signed)
Pt c/o medication issue:  1. Name of Medication: Prednisone  2. How are you currently taking this medication (dosage and times per day)?   3. Are you having a reaction (difficulty breathing--STAT)?   4. What is your medication issue? Patient called , she said her dentist wants her to take Prednisone. She wants to ask Dr Ladona Ridgel if it is alright for her to take?

## 2022-09-20 NOTE — Telephone Encounter (Signed)
Returned Pt's call. Pt stated she had called multiple times and never got through. Pt stated she had already started taking medication and no longer had any questions. Pt stated "Have a good day" and hung up the phone.

## 2022-12-08 ENCOUNTER — Other Ambulatory Visit: Payer: Self-pay | Admitting: Internal Medicine

## 2022-12-08 DIAGNOSIS — Z1212 Encounter for screening for malignant neoplasm of rectum: Secondary | ICD-10-CM

## 2022-12-08 DIAGNOSIS — Z1211 Encounter for screening for malignant neoplasm of colon: Secondary | ICD-10-CM

## 2023-01-01 ENCOUNTER — Other Ambulatory Visit: Payer: Self-pay

## 2023-01-01 DIAGNOSIS — I480 Paroxysmal atrial fibrillation: Secondary | ICD-10-CM

## 2023-01-01 MED ORDER — APIXABAN 5 MG PO TABS: ORAL_TABLET | ORAL | 3 refills | Status: DC

## 2023-01-01 NOTE — Telephone Encounter (Signed)
Prescription refill request for Eliquis received. Indication:afib Last office visit:11/23 Scr:1.19  2/24 Age: 57 Weight:128.5  kg  Prescription refilled

## 2023-01-19 ENCOUNTER — Encounter: Payer: Self-pay | Admitting: Physician Assistant

## 2023-01-19 ENCOUNTER — Ambulatory Visit: Payer: BC Managed Care – PPO | Attending: Physician Assistant

## 2023-01-19 ENCOUNTER — Ambulatory Visit: Payer: BC Managed Care – PPO | Attending: Physician Assistant | Admitting: Physician Assistant

## 2023-01-19 ENCOUNTER — Encounter: Payer: Self-pay | Admitting: Internal Medicine

## 2023-01-19 VITALS — BP 110/74 | HR 67 | Ht 69.0 in | Wt 244.2 lb

## 2023-01-19 DIAGNOSIS — R002 Palpitations: Secondary | ICD-10-CM | POA: Diagnosis not present

## 2023-01-19 DIAGNOSIS — I10 Essential (primary) hypertension: Secondary | ICD-10-CM

## 2023-01-19 DIAGNOSIS — D6869 Other thrombophilia: Secondary | ICD-10-CM | POA: Diagnosis not present

## 2023-01-19 DIAGNOSIS — I480 Paroxysmal atrial fibrillation: Secondary | ICD-10-CM

## 2023-01-19 NOTE — Progress Notes (Unsigned)
Enrolled patient for a 14 day Zio XT monitor to be mailed to patients home around 02/16/23   Shelley Edwards to read

## 2023-01-19 NOTE — Progress Notes (Signed)
  Cardiology Office Note:  .   Date:  01/19/2023  ID:  Janace Aris, DOB [birth year], MRN [id number] PCP: Wanda Plump, MD  [facility] Providers Cardiologist:  Lewayne Bunting, MD {  History of Present Illness: .   Wileen Seaman is a 57 y.o. female w/PMHx of HTN, AFib/flutter  She last saw Dr. Ladona Ridgel 01/10/22, working on weight loss, considering Ozempic, recurrent palpitations, mostly AFlutter (rather then AFib)   Today's visit is scheduled as an annual visit  ROS:   She is doing really well Since her divorce, she has started regular exercise with Yoga/Pilates, and with healthy eating lost 60+ pounds She is very active, travels quite a lot for work, and reports excellent exertional capacity No CP No SOB, DOE No near syncope or syncope No bleeding/signs of bleeding She had not had any palpitations all year, until last month, had about 2 weeks of intermittent/brief flutters, Lchest/heart seemed to feel them laterally towards her armpit No clear trigger, no missed medicines  She asks bout changing propafenone back to flecainide She reports she asked Dr. Ladona Ridgel to stop flecainide because it made her have a very unusual sensation in her thighs But the propafenone makes her feel more fatigued/just not as well.    Arrhythmia/AAD hx CTI ablation 09/20/2016 Flecainide stopped 2021 Propafenone started 2021  Studies Reviewed: Marland Kitchen    EKG done today and reviewed by myself:  SR 67bpm, normal intervals   09/20/2016: EPS/ablation CONCLUSIONS:  1. Inducible isthmus dependent atrial flutter and paroxysmal atrial fibrillation.  2. Successful radiofrequency ablation of atrial flutter along the cavotricuspid isthmus with complete bidirectional isthmus block achieved.  3. Spontaneous atrial fib requiring DCCV  4. No early apparent complications.    Risk Assessment/Calculations:    Physical Exam:   VS:  LMP  (LMP Unknown) Comment: irregular menses   Wt Readings from Last 3  Encounters:  05/02/22 283 lb 6 oz (128.5 kg)  01/13/22 (!) 303 lb 8 oz (137.7 kg)  01/10/22 (!) 305 lb (138.3 kg)    GEN: Well nourished, well developed in no acute distress NECK: No JVD; No carotid bruits CARDIAC: RRR, no murmurs, rubs, gallops RESPIRATORY:  CTA b/l without rales, wheezing or rhonchi  ABDOMEN: Soft, non-tender, non-distended EXTREMITIES:  No edema; No deformity   ASSESSMENT AND PLAN: .    Paroxysmal AFib/AFlutter CHA2DS2Vasc is one (HTN, 2 with gender), on Eliquis, appropriately dosed Low burden Some recent flutters as described  Will get an echo and monitor prior to making any medications changes She asks about retrying flecainide, though stopped due to side effects Await testing for final recommendations Since she has lost quite a bit of weight and exercising regularly, perhaps consider seeing how she does off AAD    HTN Looks ok  Secondary hypercoagulable state 2/2 AFib    Dispo: otherwise back in 10mo, sooner if needed   Signed, Sheilah Pigeon, PA-C

## 2023-01-19 NOTE — Patient Instructions (Addendum)
Medication Instructions:  Your physician recommends that you continue on your current medications as directed. Please refer to the Current Medication list given to you today.  *If you need a refill on your cardiac medications before your next appointment, please call your pharmacy*   Lab Work: None ordered   Testing/Procedures: Your physician has requested that you have an echocardiogram. Echocardiography is a painless test that uses sound waves to create images of your heart. It provides your doctor with information about the size and shape of your heart and how well your heart's chambers and valves are working. This procedure takes approximately one hour. There are no restrictions for this procedure. Please do NOT wear cologne, perfume, aftershave, or lotions (deodorant is allowed). Please arrive 15 minutes prior to your appointment time.  Please note: We ask at that you not bring children with you during ultrasound (echo/ vascular) testing. Due to room size and safety concerns, children are not allowed in the ultrasound rooms during exams. Our front office staff cannot provide observation of children in our lobby area while testing is being conducted. An adult accompanying a patient to their appointment will only be allowed in the ultrasound room at the discretion of the ultrasound technician under special circumstances. We apologize for any inconvenience.                              ZIO XT- Long Term Monitor Instructions  Your physician has requested you wear a ZIO patch monitor for 14 days.  This is a single patch monitor. [employer] supplies one patch monitor per enrollment. Additional stickers are not available. Please do not apply patch if you will be having a Nuclear Stress Test,  Echocardiogram, Cardiac CT, MRI, or Chest Xray during the period you would be wearing the  monitor. The patch cannot be worn during these tests. You cannot remove and re-apply the  ZIO XT patch monitor.   Your ZIO patch monitor will be mailed 3 day USPS to your address on file. It may take 3-5 days  to receive your monitor after you have been enrolled.  Once you have received your monitor, please review the enclosed instructions. Your monitor  has already been registered assigning a specific monitor serial # to you.  Billing and Patient Assistance Program Information  We have supplied [employer] with any of your insurance information on file for billing purposes. [employer] offers a sliding scale Patient Assistance Program for patients that do not have  insurance, or whose insurance does not completely cover the cost of the ZIO monitor.  You must apply for the Patient Assistance Program to qualify for this discounted rate.  To apply, please call [employer] at [phone], select option 4, select option 2, ask to apply for  Patient Assistance Program. Meredeth Ide will ask your household income, and how many people  are in your household. They will quote your out-of-pocket cost based on that information.  [employer] will also be able to set up a 51-month, interest-free payment plan if needed.  Applying the monitor   Shave hair from upper left chest.  Hold abrader disc by orange tab. Rub abrader in 40 strokes over the upper left chest as  indicated in your monitor instructions.  Clean area with 4 enclosed alcohol pads. Let dry.  Apply patch as indicated in monitor instructions. Patch will be placed under collarbone on left  side of chest with arrow pointing upward.  Rub patch  adhesive wings for 2 minutes. Remove white label marked "1". Remove the white  label marked "2". Rub patch adhesive wings for 2 additional minutes.  While looking in a mirror, press and release button in center of patch. A small green light will  flash 3-4 times. This will be your only indicator that the monitor has been turned on.  Do not shower for the first 24 hours. You may shower after the first 24 hours.  Press the button if  you feel a symptom. You will hear a small click. Record Date, Time and  Symptom in the Patient Logbook.  When you are ready to remove the patch, follow instructions on the last 2 pages of Patient  Logbook. Stick patch monitor onto the last page of Patient Logbook.  Place Patient Logbook in the blue and white box. Use locking tab on box and tape box closed  securely. The blue and white box has prepaid postage on it. Please place it in the mailbox as  soon as possible. Your physician should have your test results approximately 7 days after the  monitor has been mailed back to [facility].  Call [facility] Customer Care at [phone] if you have questions regarding  your ZIO XT patch monitor. Call them immediately if you see an orange light blinking on your  monitor.  If your monitor falls off in less than 4 days, contact our Monitor department at [phone].  If your monitor becomes loose or falls off after 4 days call [employer] at [phone] for  suggestions on securing your monitor  Follow-Up: At [facility], you and your health needs are our priority.  As part of our continuing mission to provide you with exceptional heart care, we have created designated Provider Care Teams.  These Care Teams include your primary Cardiologist (physician) and Advanced Practice Providers (APPs -  Physician Assistants and Nurse Practitioners) who all work together to provide you with the care you need, when you need it.  Your next appointment:   6 month(s)  The format for your next appointment:   In Person  Provider:   Francis Dowse, PA-C    Thank you for choosing [facility]!!   Dory Horn, RN [phone]

## 2023-02-21 ENCOUNTER — Telehealth: Payer: BC Managed Care – PPO | Admitting: Family Medicine

## 2023-02-21 ENCOUNTER — Ambulatory Visit: Payer: Self-pay | Admitting: Internal Medicine

## 2023-02-21 VITALS — Ht 69.0 in | Wt 244.0 lb

## 2023-02-21 DIAGNOSIS — L259 Unspecified contact dermatitis, unspecified cause: Secondary | ICD-10-CM | POA: Diagnosis not present

## 2023-02-21 MED ORDER — PREDNISONE 20 MG PO TABS: 40.0000 mg | ORAL_TABLET | Freq: Every day | ORAL | 0 refills | Status: AC

## 2023-02-21 NOTE — Telephone Encounter (Addendum)
  Chief Complaint: Forehead, eyelid, nose and upper lip swelling after using a new facial cream containing Tumeric Symptoms: Swelling of face, mild itching, rash Frequency: Began Sunday, has been worsening since then Pertinent Negatives: Patient denies Pt denies tongue or throat swelling, difficulty breathing Disposition: [] ED /[] Urgent Care (no appt availability in office) / [x] Appointment(In office/virtual)/ []  [place] [facility]/ [] Home Care/ [] Refused Recommended Disposition /[] [place] [facility]/ []  Follow-up with PCP Additional Notes: Pt reports she began using a new facial cream containing Tumeric on Sunday, she noticed increasing worsening facial swelling of the forehead, eyes, nose and upper lip with a mild rash/itching. Pt denies any SOB, difficulty breathing or tongue or throat swelling. Pt reports she has since stopped using the cream. Pt is working from home and unable to come to office, virtual visit scheduled for 4 pm this afternoon. This RN educated patient on symptoms to watch for to call back or go to ED. Pt verbalized understanding and agrees to plan.   Copied from CRM [id number]. Topic: Clinical - Red Word Triage >> Feb 21, 2023 12:22 PM Louie Boston wrote: Red Word that prompted transfer to Nurse Triage: Allergic Reaction Reason for Disposition  SEVERE swelling of the entire face  Answer Assessment - Initial Assessment Questions 1. ONSET: "When did the swelling start?" (e.g., minutes, hours, days)     Past Sunday, worsening every day 2. LOCATION: "What part of the face is swollen?"     Eyelids, Forehead, Nose, Upper Lip 3. SEVERITY: "How swollen is it?"     Worsening over time, "swelling seems to be growing" 4. ITCHING: "Is there any itching?" If Yes, ask: "How much?"   (Scale 1-10; mild, moderate or severe)     Intermittent, mild 5. PAIN: "Is the swelling painful to touch?" If Yes, ask: "How painful is it?"   (Scale 1-10; mild, moderate or severe)   - NONE (0): no  pain   - MILD (1-3): doesn't interfere with normal activities    - MODERATE (4-7): interferes with normal activities or awakens from sleep    - SEVERE (8-10): excruciating pain, unable to do any normal activities      No 6. FEVER: "Do you have a fever?" If Yes, ask: "What is it, how was it measured, and when did it start?"      No 7. CAUSE: "What do you think is causing the face swelling?"     Patient reports using a new cream with Tumeric in it, has stopped using this product 8. RECURRENT SYMPTOM: "Have you had face swelling before?" If Yes, ask: "When was the last time?" "What happened that time?"     No 9. OTHER SYMPTOMS: "Do you have any other symptoms?" (e.g., toothache, leg swelling)     None  Protocols used: Face Swelling-A-AH

## 2023-02-21 NOTE — Telephone Encounter (Signed)
Pt has a pending apt today at 4pm with Clayborne Dana, NP

## 2023-02-21 NOTE — Telephone Encounter (Signed)
Noted  

## 2023-02-21 NOTE — Progress Notes (Signed)
Started Sunday Swelling around eyes Using ice  Can feel tiny bumps on face (cheeks, forehead, mouth)  Itching everywhere

## 2023-02-21 NOTE — Progress Notes (Signed)
Virtual Video Visit via MyChart Note  I connected with  Shelley Edwards on 02/21/23 at  4:00 PM EST by the video enabled telemedicine application for MyChart, and verified that I am speaking with the correct person using two identifiers.   I introduced myself as a Publishing rights manager with the practice. We discussed the limitations of evaluation and management by telemedicine and the availability of in person appointments. The patient expressed understanding and agreed to proceed.  Participating parties in this visit include: The patient and the nurse practitioner listed.  The patient is: At home I am: In the office - [facility]  Subjective:    CC:  Chief Complaint  Patient presents with   Edema    HPI: Shelley Edwards is a 57 y.o. year old female presenting today via MyChart today for rash.  Discussed the use of AI scribe software for clinical note transcription with the patient, who gave verbal consent to proceed.  History of Present Illness   The patient presents with a chief complaint of a dry, itchy skin rash that has progressively worsened over the past few days. The rash initially appeared on the face, particularly around the eyes, causing puffiness and discomfort. The patient suspects an allergic reaction to a new cream containing turmeric, which they had recently started using. Despite discontinuing the cream and avoiding other potential irritants, the rash has not improved and has even spread to other areas, including the neck and chin. The patient reports feeling small, itchy bumps in these areas, although they are not visible in the mirror. The patient denies any systemic symptoms such as trouble breathing or tongue swelling, indicating that the reaction is likely limited to the skin. The patient is concerned about the rash worsening and interfering with their upcoming holiday plans.            Past medical history, Surgical history, Family  history not pertinant except as noted below, Social history, Allergies, and medications have been entered into the medical record, reviewed, and corrections made.   Review of Systems:  All review of systems negative except what is listed in the HPI   Objective:    General:  Speaking clearly in complete sentences. Absent shortness of breath noted.   Alert and oriented x3.   Normal judgment.  Absent acute distress. Some mild inflammation noted around eyes. Unable to visualize rash on camera.    Impression and Recommendations:    Problem List Items Addressed This Visit   None Visit Diagnoses       Contact dermatitis, unspecified contact dermatitis type, unspecified trigger    -  Primary   Relevant Medications   predniSONE (DELTASONE) 20 MG tablet       New onset facial rash with itching and puffiness, likely secondary to a new cream. No systemic symptoms. -Start Prednisone 40 mg daily for 5 days. -Start daily allergy pill (Claritin or Zyrtec) and Pepcid twice daily for 2 weeks. -Use gentle soap and lotion for face care. -Consider over-the-counter hydrocortisone for localized itching. -Consider allergy referral if symptoms recur. -Patient aware of signs and symptoms requiring further/urgent evaluation.         Follow-up if symptoms worsen or fail to improve.    I discussed the assessment and treatment plan with the patient. The patient was provided an opportunity to ask questions and all were answered. The patient agreed with the plan and demonstrated an understanding of the instructions.   The patient was  advised to call back or seek an in-person evaluation if the symptoms worsen or if the condition fails to improve as anticipated.   Clayborne Dana, NP

## 2023-02-24 DIAGNOSIS — R002 Palpitations: Secondary | ICD-10-CM

## 2023-02-24 DIAGNOSIS — I480 Paroxysmal atrial fibrillation: Secondary | ICD-10-CM | POA: Diagnosis not present

## 2023-03-02 ENCOUNTER — Ambulatory Visit (HOSPITAL_COMMUNITY): Payer: BC Managed Care – PPO | Attending: Physician Assistant

## 2023-03-02 ENCOUNTER — Other Ambulatory Visit: Payer: Self-pay | Admitting: Internal Medicine

## 2023-03-02 DIAGNOSIS — I480 Paroxysmal atrial fibrillation: Secondary | ICD-10-CM | POA: Insufficient documentation

## 2023-03-02 LAB — ECHOCARDIOGRAM COMPLETE: S' Lateral: 3.6 cm

## 2023-03-12 ENCOUNTER — Encounter: Payer: Self-pay | Admitting: Internal Medicine

## 2023-03-12 ENCOUNTER — Ambulatory Visit: Payer: Self-pay | Admitting: Internal Medicine

## 2023-03-12 ENCOUNTER — Ambulatory Visit: Payer: BC Managed Care – PPO | Admitting: Internal Medicine

## 2023-03-12 VITALS — BP 116/76 | HR 105 | Temp 97.6°F | Resp 16 | Ht 69.0 in | Wt 245.0 lb

## 2023-03-12 DIAGNOSIS — L2089 Other atopic dermatitis: Secondary | ICD-10-CM | POA: Diagnosis not present

## 2023-03-12 DIAGNOSIS — L309 Dermatitis, unspecified: Secondary | ICD-10-CM

## 2023-03-12 MED ORDER — PREDNISONE 10 MG PO TABS: ORAL_TABLET | ORAL | 0 refills | Status: DC

## 2023-03-12 MED ORDER — TRIAMCINOLONE ACETONIDE 0.1 % EX LOTN: 1.0000 | TOPICAL_LOTION | Freq: Three times a day (TID) | CUTANEOUS | 0 refills | Status: DC

## 2023-03-12 NOTE — Progress Notes (Signed)
   Subjective:    Patient ID: Shelley Edwards, female    DOB: 01/12/1966, 58 y.o.   MRN: [id number]  DOS:  03/12/2023 Type of visit - description: acute   Symptoms started before Christmas: Itchy, dry skin mostly around the eyes, lips, face, neck and upper chest. Was seen virtually, prescribed prednisone , improved temporarily. Symptoms resurface 2 days ago.  Denies hives, tongue swelling, wheezing or cough.  I noticed 2 groups of blisters at the left chest, states they are related to a patch she wear for cardiology .  The itching and dry skin preceded the placement of the patch.  Review of Systems See above   Past Medical History:  Diagnosis Date   Abnormal uterine bleeding 07/2015   Acute meniscal tear, medial    left   H/O atrial flutter    Headache(784.0)     Past Surgical History:  Procedure Laterality Date   A-FLUTTER ABLATION N/A 09/20/2016   Procedure: A-Flutter Ablation;  Surgeon: Waddell Danelle ORN, MD;  Location: [facility] INVASIVE [facility];  Service: Cardiovascular;  Laterality: N/A;   APPENDECTOMY  at age 79   BACK SURGERY     CHOLECYSTECTOMY  ~2002   KNEE ARTHROSCOPY Left 07/27/2016   Procedure: LEFT KNEE ARTHROSCOPY WITH PARTIAL MEDIAL MENISCECTOMY;  Surgeon: Vernetta Lonni GRADE, MD;  Location: [facility] OR;  Service: Orthopedics;  Laterality: Left;   KNEE SURGERY      Current Outpatient Medications  Medication Instructions   apixaban  (ELIQUIS ) 5 MG TABS tablet TAKE 1 TABLET(5 MG) BY MOUTH TWICE DAILY   cetirizine (ZYRTEC) 10 mg, Daily   metoprolol  succinate (TOPROL -XL) 100 MG 24 hr tablet TAKE 1 TABLET(100 MG) BY MOUTH IN THE MORNING AND AT BEDTIME WITH OR IMMEDIATELY FOLLOWING A MEAL   propafenone  (RYTHMOL  SR) 325 MG 12 hr capsule TAKE 1 CAPSULE(325 MG) BY MOUTH TWICE DAILY       Objective:   Physical Exam BP 116/76   Pulse (!) 105   Temp 97.6 F (36.4 C) (Oral)   Resp 16   Ht 5' 9 (1.753 m)   Wt 245 lb (111.1 kg)   LMP  (LMP Unknown) Comment: irregular menses   SpO2 96%   BMI 36.18 kg/m  General:   Well developed, NAD, BMI noted. HEENT:  Normocephalic . Face symmetric, atraumatic Skin: skin at the forehead >eyelids>face seems very dry and slightly swollen.  No erythema, no warmness. Lips are very dry. At the left chest has 2 patches of blisters (from patch). No acute skin  lesions at the back. Neurologic:  alert & oriented X3.  Speech normal, gait appropriate for age and unassisted Psych--  Cognition and judgment appear intact.  Cooperative with normal attention span and concentration.  Behavior appropriate. No anxious or depressed appearing.      Assessment    ASSESSMENT P- A fib, ablation 09/2016  Pre monopausal , LMP ~ 07-2015 Morbid obesity  PLAN Atopic dermatitis: As described above, strongly denies any new soap, medicine, make-up, cleanser. Was seen by Waddell recently, improved temporarily with prednisone .    Plan: - Dermatology referral - Stop Zyrtec, start Allegra 180 mg daily. - Kenalog  lotion. - Stop make-up (this patient declines, has a business trip, plans to minimize its use). - Second round of prednisone  sent.  Patient is not sure if she will proceed. Routine office visit due, encouraged to schedule.

## 2023-03-12 NOTE — Patient Instructions (Signed)
 Stop Zyrtec, take Allegra 180 mg 1 tablet daily  Minimize the use of make-up  Okay to apply Kenalog  lotion to your face.  You can also apply a small amount on your eyelids, do not let it run into your eyes.  You can use prednisone  if you must  We are referring you to a dermatologist.  You are due for a routine visit with me, please arrange at your convenience

## 2023-03-12 NOTE — Telephone Encounter (Signed)
 Copied from CRM [id number]. Topic: Clinical - Red Word Triage >> Mar 12, 2023  2:18 PM Macario HERO wrote: Red Word that prompted transfer to Nurse Triage: Patient stated she has been experiencing an  allergic reaction, itching burning and swollen eyelids. Stated she got medication for it but nothing is helping.   Chief Complaint: eye swelling, itchiness Symptoms: severe bilateral eye swelling and itchiness, pt thought fever last night Frequency: continual, recurrent from last month Pertinent Negatives: Patient denies discharge from eye, fever today, vision changes, pain anywhere, SOB, trouble swallowing Disposition: [] ED /[] Urgent Care (no appt availability in office) / [x] Appointment(In office/virtual)/ []  [place] [facility]/ [] Home Care/ [] Refused Recommended Disposition /[] [place] [facility]/ []  Follow-up with PCP Additional Notes: Pt reporting that both eyes with right is worse than left are swollen to the top eyelids as well as itchy with dry skin. Pt reporting that she is also having the same dry skin and itchiness with tiny little bumps on top of her lip with some redness there. Pt reporting she feels the little bumps on her face and down her neck. Pt reporting that she had this issue before Christmas but that it went away with prednisone  and zyrtec. Pt reporting now returned yesterday but worsened this morning. Pt confirms no newer contact with something that would have touched these areas. Pt reporting that last night she was feeling really hot on inside and cold on outside for few hours but did not take her temp at that time. Pt reporting no anaphylaxis. Pt reporting no fever today, swelling has gone down some since this morning after using ice but still considerate. Advised pt be seen within 4 hours, scheduled with PCP today. Pt verbalized understanding to go to hospital if worsening symptoms, or new symptoms like SOB or trouble swallowing.  Reason for Disposition  [1] SEVERE  eyelid swelling (i.e., shut or almost) AND [2] involves both eyes (Exception: Itchy eyes, which  are probably an allergic reaction.)  Answer Assessment - Initial Assessment Questions 1. ONSET: When did the swelling start? (e.g., minutes, hours, days)     Started a little bit before Christmas, seen doc for it previously, it got better, started coming back again yesterday but really bad this morning, came back with a vengeance. 2. LOCATION: What part of the eyelids is swollen?     Top part of eyelid 3. SEVERITY: How swollen is it?     Right one was close to almost being swollen shut like somebody punched me, not the bruising, went down a bit, still considerate, had put some ice 4. ITCHING: Is there any itching? If Yes, ask: How much?   (Scale 1-10; mild, moderate or severe)     Varies between mild and moderate 5. PAIN: Is the swelling painful to touch? If Yes, ask: How painful is it?   (Scale 1-10; mild, moderate or severe)     No pain 6. FEVER: Do you have a fever? If Yes, ask: What is it, how was it measured, and when did it start?      Last night felt like was having some fever, didn't take temp in time but had some chills 7. CAUSE: What do you think is causing the swelling?     Don't know at this point 8. RECURRENT SYMPTOM: Have you had eyelid swelling before? If Yes, ask: When was the last time? What happened that time?     Talked to somebody before Christmas told her to take prednisone  and zyrtec.  9. OTHER SYMPTOMS: Do you have any other symptoms? (e.g., blurred vision, eye discharge, rash, runny nose)     No SOB, some dry skin on eyelids and itching/burning, same on top of lip, tiny little bumps that are on face and down neck. Some redness to the areas not anything dramatic on the upper eyelid and near upper lip something looks funny on the sides of my chin can feel bumps there. No changes to vision  Protocols used: Eye - Swelling-A-AH

## 2023-03-13 NOTE — Assessment & Plan Note (Signed)
 Atopic dermatitis: As described above, strongly denies any new soap, medicine, make-up, cleanser. Was seen by Waddell recently, improved temporarily with prednisone .    Plan: - Dermatology referral - Stop Zyrtec, start Allegra 180 mg daily. - Kenalog  lotion. - Stop make-up (this patient declines, has a business trip, plans to minimize its use). - Second round of prednisone  sent.  Patient is not sure if she will proceed. Routine office visit due, encouraged to schedule.

## 2023-03-19 ENCOUNTER — Telehealth: Payer: Self-pay | Admitting: Physician Assistant

## 2023-03-19 NOTE — Telephone Encounter (Signed)
 Briana from [facility] calling with end of summary report. Report loaded into patient chart  Patient had an episode of aflutter with 183 bpm for 60 sec 12/29 712 pm  Page 9 strip 5

## 2023-03-19 NOTE — Telephone Encounter (Signed)
 Briana with [employer] calling to report abnormal monitor results

## 2023-03-26 NOTE — Telephone Encounter (Signed)
Patient has appointment on 04/09/23.

## 2023-04-09 ENCOUNTER — Ambulatory Visit (HOSPITAL_COMMUNITY)
Admission: RE | Admit: 2023-04-09 | Discharge: 2023-04-09 | Disposition: A | Discharge status: Home / Self Care | Payer: BC Managed Care – PPO | Source: Ambulatory Visit | Attending: Physician Assistant | Admitting: Physician Assistant

## 2023-04-09 ENCOUNTER — Encounter (HOSPITAL_COMMUNITY): Payer: Self-pay | Admitting: Physician Assistant

## 2023-04-09 VITALS — BP 122/70 | HR 62 | Ht 69.0 in | Wt 243.6 lb

## 2023-04-09 DIAGNOSIS — I10 Essential (primary) hypertension: Secondary | ICD-10-CM | POA: Insufficient documentation

## 2023-04-09 DIAGNOSIS — E669 Obesity, unspecified: Secondary | ICD-10-CM | POA: Diagnosis not present

## 2023-04-09 DIAGNOSIS — I4892 Unspecified atrial flutter: Secondary | ICD-10-CM | POA: Insufficient documentation

## 2023-04-09 DIAGNOSIS — I483 Typical atrial flutter: Secondary | ICD-10-CM

## 2023-04-09 DIAGNOSIS — I480 Paroxysmal atrial fibrillation: Secondary | ICD-10-CM | POA: Insufficient documentation

## 2023-04-09 DIAGNOSIS — E785 Hyperlipidemia, unspecified: Secondary | ICD-10-CM | POA: Diagnosis not present

## 2023-04-09 DIAGNOSIS — Z6835 Body mass index (BMI) 35.0-35.9, adult: Secondary | ICD-10-CM | POA: Insufficient documentation

## 2023-04-09 MED ORDER — FLECAINIDE ACETATE 100 MG PO TABS: 100.0000 mg | ORAL_TABLET | Freq: Two times a day (BID) | ORAL | 0 refills | Status: DC

## 2023-04-09 MED ORDER — FLECAINIDE ACETATE 100 MG PO TABS: 100.0000 mg | ORAL_TABLET | Freq: Two times a day (BID) | ORAL | 6 refills | Status: DC

## 2023-04-09 MED ORDER — METOPROLOL SUCCINATE ER 100 MG PO TB24: 50.0000 mg | ORAL_TABLET | Freq: Two times a day (BID) | ORAL | Status: DC

## 2023-04-09 NOTE — Progress Notes (Signed)
Primary Care Physician: Wanda Plump, MD Primary Cardiologist: Lewayne Bunting, MD Electrophysiologist: None  Referring Physician: Francis Dowse PA   Shelley Edwards is a 58 y.o. female with a history of HTN, HLD, atrial flutter, atrial fibrillation who presents for follow up in the [facility].  The patient was initially diagnosed with atrial flutter and underwent CTI ablation with Dr Ladona Ridgel in 2018. Patient is on Eliquis for stroke prevention. She wore a cardiac monitor 03/2023 which showed 100% atrial flutter burden with overall controlled heart rates but rapid at times. She was asymptomatic while wearing the monitor. She is in SR today and feels well. No bleeding issues on anticoagulation.    Today, she denies symptoms of palpitations, chest pain, shortness of breath, orthopnea, PND, lower extremity edema, dizziness, presyncope, syncope, snoring, daytime somnolence, bleeding, or neurologic sequela. The patient is tolerating medications without difficulties and is otherwise without complaint today.    Atrial Fibrillation Risk Factors:  she does not have symptoms or diagnosis of sleep apnea. she does not have a history of rheumatic fever.   Atrial Fibrillation Management history:  Previous antiarrhythmic drugs: flecainide, propafenone  Previous cardioversions: none Previous ablations: CTI 09/20/16 Anticoagulation history: Eliquis  ROS- All systems are reviewed and negative except as per the HPI above.  Past Medical History:  Diagnosis Date   Abnormal uterine bleeding 07/2015   Acute meniscal tear, medial    left   H/O atrial flutter    Headache(784.0)     Current Outpatient Medications  Medication Sig Dispense Refill   apixaban (ELIQUIS) 5 MG TABS tablet TAKE 1 TABLET(5 MG) BY MOUTH TWICE DAILY 180 tablet 3   cetirizine (ZYRTEC) 10 MG tablet Take 10 mg by mouth daily.     triamcinolone lotion (KENALOG) 0.1 % Apply 1 Application topically 3 (three) times  daily. 120 mL 0   flecainide (TAMBOCOR) 100 MG tablet Take 1 tablet (100 mg total) by mouth 2 (two) times daily. 180 tablet 0   metoprolol succinate (TOPROL-XL) 100 MG 24 hr tablet Take 0.5 tablets (50 mg total) by mouth in the morning and at bedtime. Take with or immediately following a meal.     No current facility-administered medications for this encounter.    Physical Exam: BP 122/70   Pulse 62   Ht 5\' 9"  (1.753 m)   Wt 110.5 kg   LMP  (LMP Unknown) Comment: irregular menses  BMI 35.97 kg/m   GEN: Well nourished, well developed in no acute distress CARDIAC: Regular rate and rhythm, no murmurs, rubs, gallops RESPIRATORY:  Clear to auscultation without rales, wheezing or rhonchi  ABDOMEN: Soft, non-tender, non-distended EXTREMITIES:  No edema; No deformity   Wt Readings from Last 3 Encounters:  04/09/23 110.5 kg  03/12/23 111.1 kg  02/21/23 110.7 kg     EKG today demonstrates  SR Vent. rate 62 BPM PR interval 196 ms QRS duration 88 ms QT/QTcB 408/414 ms  Echo 03/02/23 demonstrated   1. Left ventricular ejection fraction, by estimation, is 60 to 65%. The  left ventricle has normal function. The left ventricle has no regional  wall motion abnormalities. Left ventricular diastolic parameters are  indeterminate.   2. Right ventricular systolic function is normal. The right ventricular  size is normal.   3. The mitral valve is normal in structure. Trivial mitral valve  regurgitation. No evidence of mitral stenosis.   4. Tricuspid valve regurgitation is mild to moderate.   5. The aortic valve  is normal in structure. Aortic valve regurgitation is  not visualized. No aortic stenosis is present.   6. Aortic dilatation noted. There is mild dilatation of the ascending  aorta, measuring 40 mm.   7. The inferior vena cava is normal in size with greater than 50%  respiratory variability, suggesting right atrial pressure of 3 mmHg.    CHA2DS2-VASc Score = 2  The patient's  score is based upon: CHF History: 0 HTN History: 1 Diabetes History: 0 Stroke History: 0 Vascular Disease History: 0 Age Score: 0 Gender Score: 1       ASSESSMENT AND PLAN: Paroxysmal Atrial Fibrillation/atrial flutter The patient's CHA2DS2-VASc score is 2, indicating a 2.2% annual risk of stroke.   S/p CTI ablation 09/2016 She is in SR today. We discussed rhythm control options. She would like to try flecainide again. It was stopped in 2021 due to generalized weakness.  Will stop propafenone and start flecainide 100 mg BID after a 3 day washout.  If she has recurrence of side effects will consider dofetilide or ablation.  We also discussed [employer] mobile for home monitoring.  Continue Eliquis 5 mg BID Continue Toprol 50 mg BID (she cuts 100 mg in half).   HTN Stable on current regimen  Obesity Body mass index is 35.97 kg/m.  Encouraged lifestyle modification    Follow up in the AF clinic in two weeks for ECG.        Jorja Loa PA-C [facility] [address] [place], [place] [address] [phone]

## 2023-04-09 NOTE — Patient Instructions (Signed)
[employer] Mobile  Stop Propafenone  Start Flecainide 100mg  twice daily(3 Days After Stop Propafenone)

## 2023-04-19 DIAGNOSIS — L249 Irritant contact dermatitis, unspecified cause: Secondary | ICD-10-CM | POA: Diagnosis not present

## 2023-04-23 ENCOUNTER — Encounter (HOSPITAL_COMMUNITY): Payer: BC Managed Care – PPO | Admitting: Physician Assistant

## 2023-04-30 ENCOUNTER — Ambulatory Visit (HOSPITAL_COMMUNITY)
Admission: RE | Admit: 2023-04-30 | Discharge: 2023-04-30 | Disposition: A | Discharge status: Home / Self Care | Payer: BC Managed Care – PPO | Source: Ambulatory Visit | Attending: Physician Assistant | Admitting: Physician Assistant

## 2023-04-30 VITALS — HR 54

## 2023-04-30 DIAGNOSIS — Z5181 Encounter for therapeutic drug level monitoring: Secondary | ICD-10-CM | POA: Insufficient documentation

## 2023-04-30 DIAGNOSIS — I480 Paroxysmal atrial fibrillation: Secondary | ICD-10-CM | POA: Diagnosis not present

## 2023-04-30 DIAGNOSIS — Z79899 Other long term (current) drug therapy: Secondary | ICD-10-CM

## 2023-04-30 NOTE — Progress Notes (Signed)
 Patient returns for ECG after resuming flecainide. ECG shows:  SB Vent. rate 54 BPM PR interval 186 ms QRS duration 88 ms QT/QTcB 460/436 ms  Patient reports that she feels "great". Continue present dose of flecainide. Follow up with Francis Dowse per recall.

## 2023-05-16 ENCOUNTER — Encounter: Payer: Self-pay | Admitting: Internal Medicine

## 2023-06-13 ENCOUNTER — Encounter: Payer: Self-pay | Admitting: Internal Medicine

## 2023-08-06 ENCOUNTER — Ambulatory Visit: Payer: Self-pay

## 2023-08-06 ENCOUNTER — Encounter: Payer: Self-pay | Admitting: Internal Medicine

## 2023-08-06 NOTE — Telephone Encounter (Signed)
 Copied from CRM [id number]. Topic: Clinical - Red Word Triage >> Aug 06, 2023  1:12 PM Melissa C wrote: [facility] that prompted transfer to Nurse Triage: patient has had a terrible hemorrhoid flare up since Wednesday afternoon and has been in a lot of pain. Patient needs something to help with this  Chief Complaint: hemorrhoids Symptoms: pain, stinging Frequency: constant Pertinent Negatives: Patient denies fever, abd pain, blood in stool Disposition: [] ED /[] Urgent Care (no appt availability in office) / [x] Appointment(In office/virtual)/ []  [place] [facility]/ [] Home Care/ [] Refused Recommended Disposition /[] [place] [facility]/ []  Follow-up with PCP Additional Notes: apt scheduled for tomorrow am; care advice given, denies questions; instructed to go to ER if becomes worse.   Reason for Disposition  SEVERE rectal pain (e.g., excruciating, unable to have a bowel movement)  Answer Assessment - Initial Assessment Questions 1. SYMPTOM:  "What's the main symptom you're concerned about?" (e.g., pain, itching, swelling, rash)     Pain, swelling 2. ONSET: "When did the swelling  start?"     Wednesday 3. RECTAL PAIN: "Do you have any pain around your rectum?" "How bad is the pain?"  (Scale 0-10; or mild, moderate, severe)   - NONE (0): no pain   - MILD (1-3): doesn't interfere with normal activities    - MODERATE (4-7): interferes with normal activities or awakens from sleep, limping    - SEVERE (8-10): excruciating pain, unable to have a bowel movement      severe 4. RECTAL ITCHING: "Do you have any itching in this area?" "How bad is the itching?"  (Scale 0-10; or mild, moderate, severe)   - NONE: no itching   - MILD: doesn't interfere with normal activities    - MODERATE-SEVERE: interferes with normal activities or awakens from sleep     denies 5. CONSTIPATION: "Do you have constipation?" If Yes, ask: "How bad is it?"     no 6. CAUSE: "What do you think is causing the anus  symptoms?"     hemorrhoids 7. OTHER SYMPTOMS: "Do you have any other symptoms?"  (e.g., abdomen pain, fever, rectal bleeding, vomiting)     no 8. PREGNANCY: "Is there any chance you are pregnant?" "When was your last menstrual period?"     na  Protocols used: Rectal Symptoms-A-AH

## 2023-08-07 ENCOUNTER — Encounter: Payer: Self-pay | Admitting: Family Medicine

## 2023-08-07 ENCOUNTER — Telehealth: Payer: Self-pay

## 2023-08-07 ENCOUNTER — Ambulatory Visit: Admitting: Family Medicine

## 2023-08-07 VITALS — BP 100/55 | HR 50 | Ht 69.0 in | Wt 238.0 lb

## 2023-08-07 DIAGNOSIS — K644 Residual hemorrhoidal skin tags: Secondary | ICD-10-CM

## 2023-08-07 MED ORDER — LIDOCAINE HCL 2 % EX GEL: 1.0000 | CUTANEOUS | 1 refills | Status: DC | PRN

## 2023-08-07 MED ORDER — HYDROCORTISONE (PERIANAL) 2.5 % EX CREA
1.0000 | TOPICAL_CREAM | Freq: Two times a day (BID) | CUTANEOUS | 0 refills | Status: DC
Start: 2023-08-07 — End: 2023-11-14

## 2023-08-07 NOTE — Telephone Encounter (Signed)
 Copied from CRM [id number]. Topic: Clinical - Prescription Issue >> Aug 07, 2023  2:29 PM Magdalene School wrote: Reason for CRM: Patient called stating that she received two prescriptions today 08/07/23  at her appointment but pharmacy stated that frequency was not stated on prescription lidocaine (XYLOCAINE) 2 % jelly. Patient needs medication today because she is in a lot of pain.

## 2023-08-07 NOTE — Telephone Encounter (Signed)
 Rx resent.

## 2023-08-07 NOTE — Patient Instructions (Signed)
 Avoid prolonged sitting.  Avoid straining with bowel movements.  Increase high fiber food intake.   Participate in regular exercise. Bulk forming agents: Metamucil, Citrucel, Fibercon Stool softeners: Colace Irritation/Itching relief: OTC Anusol, Preparation H, warm sitz baths Sending in steroid cream to use twice daily and lidocaine cream to help with the pain.  Referral to general surgery for additional treatment if this does not help.

## 2023-08-07 NOTE — Progress Notes (Signed)
 Acute Office Visit  Subjective:     Patient ID: Shelley Edwards, female    DOB: [birth year], 58 y.o.   MRN: [id number]  Chief Complaint  Patient presents with   Hemorrhoids    HPI Patient is in today for hemorrhoids.    Discussed the use of AI scribe software for clinical note transcription with the patient, who gave verbal consent to proceed.  History of Present Illness Shelley Edwards is a 58 year old female with a history of hemorrhoids who presents with severe hemorrhoid pain.  She has been experiencing severe hemorrhoid pain for approximately six days, described as 'excruciating' and causing tears. This episode is more severe than previous ones, which occurred about three years ago. The pain radiates and is suspected to involve both external and internal hemorrhoids. It worsens with sitting and after bowel movements, particularly after consuming a banana, which she likened to 'eating acid'. The pain is severe enough to prevent her from working.  She has been using over-the-counter treatments such as warm baths, medicated wipes with witch hazel, and Aleve for pain relief, but these have not been effective. She also applied a cream similar to Preparation H, purchased from [employer], without significant relief. Currently, she is using a sitz bath three times a day.  She reports a high tolerance for pain but rates her current pain as a four out of ten. She experienced bleeding on Saturday morning with her first bowel movement, which was bright red and stopped quickly. No recent constipation, attributing regular bowel movements to her daily intake of MCT oil in her tea or coffee, which she started after experiencing constipation from heart medication years ago.          ROS All review of systems negative except what is listed in the HPI      Objective:    BP (!) 100/55   Pulse (!) 50   Ht 5\' 9"  (1.753 m)   Wt 238 lb (108 kg)   LMP  (LMP Unknown) Comment: irregular menses   SpO2 99%   BMI 35.15 kg/m    Physical Exam Vitals reviewed. Exam conducted with a chaperone present.  Constitutional:      Appearance: Normal appearance.  Genitourinary:    Rectum: Tenderness and external hemorrhoid present.  Neurological:     Mental Status: She is alert and oriented to person, place, and time.  Psychiatric:        Mood and Affect: Mood normal.        Behavior: Behavior normal.        Thought Content: Thought content normal.        Judgment: Judgment normal.     No results found for any visits on 08/07/23.      Assessment & Plan:   Problem List Items Addressed This Visit   None Visit Diagnoses       External hemorrhoids    -  Primary   Relevant Medications   hydrocortisone (ANUSOL-HC) 2.5 % rectal cream   lidocaine (XYLOCAINE) 2 % jelly   Other Relevant Orders   Ambulatory referral to General Surgery     Avoid prolonged sitting.  Avoid straining with bowel movements.  Increase high fiber food intake.   Participate in regular exercise. Bulk forming agents: Metamucil, Citrucel, Fibercon Stool softeners: Colace Irritation/Itching relief: OTC Anusol, Preparation H, warm sitz baths Sending in steroid cream to use twice daily and lidocaine cream to help with the pain.  Referral to general surgery for additional  treatment if this does not help.   Meds ordered this encounter  Medications   hydrocortisone (ANUSOL-HC) 2.5 % rectal cream    Sig: Place 1 Application rectally 2 (two) times daily.    Dispense:  30 g    Refill:  0    Supervising Provider:   Randie Bustle A [[id number]]   lidocaine (XYLOCAINE) 2 % jelly    Sig: Apply 1 Application topically as needed.    Dispense:  30 mL    Refill:  1    Supervising Provider:   Randie Bustle A [[id number]]    Return if symptoms worsen or fail to improve.  Shelley Hock, NP

## 2023-08-08 ENCOUNTER — Emergency Department (HOSPITAL_BASED_OUTPATIENT_CLINIC_OR_DEPARTMENT_OTHER)

## 2023-08-08 ENCOUNTER — Encounter (HOSPITAL_BASED_OUTPATIENT_CLINIC_OR_DEPARTMENT_OTHER): Payer: Self-pay | Admitting: Emergency Medicine

## 2023-08-08 ENCOUNTER — Ambulatory Visit: Payer: Self-pay

## 2023-08-08 ENCOUNTER — Emergency Department (HOSPITAL_BASED_OUTPATIENT_CLINIC_OR_DEPARTMENT_OTHER)
Admission: EM | Admit: 2023-08-08 | Discharge: 2023-08-08 | Disposition: A | Discharge status: Home / Self Care | Attending: Emergency Medicine | Admitting: Emergency Medicine

## 2023-08-08 ENCOUNTER — Other Ambulatory Visit: Payer: Self-pay

## 2023-08-08 DIAGNOSIS — K594 Anal spasm: Secondary | ICD-10-CM | POA: Diagnosis not present

## 2023-08-08 DIAGNOSIS — I4891 Unspecified atrial fibrillation: Secondary | ICD-10-CM | POA: Diagnosis not present

## 2023-08-08 DIAGNOSIS — K602 Anal fissure, unspecified: Secondary | ICD-10-CM | POA: Insufficient documentation

## 2023-08-08 DIAGNOSIS — K6289 Other specified diseases of anus and rectum: Secondary | ICD-10-CM

## 2023-08-08 DIAGNOSIS — K659 Peritonitis, unspecified: Secondary | ICD-10-CM | POA: Insufficient documentation

## 2023-08-08 DIAGNOSIS — K644 Residual hemorrhoidal skin tags: Secondary | ICD-10-CM | POA: Diagnosis not present

## 2023-08-08 DIAGNOSIS — D259 Leiomyoma of uterus, unspecified: Secondary | ICD-10-CM | POA: Diagnosis not present

## 2023-08-08 DIAGNOSIS — R109 Unspecified abdominal pain: Secondary | ICD-10-CM | POA: Diagnosis not present

## 2023-08-08 DIAGNOSIS — Z7901 Long term (current) use of anticoagulants: Secondary | ICD-10-CM | POA: Insufficient documentation

## 2023-08-08 DIAGNOSIS — K651 Peritoneal abscess: Secondary | ICD-10-CM | POA: Diagnosis not present

## 2023-08-08 DIAGNOSIS — K645 Perianal venous thrombosis: Secondary | ICD-10-CM | POA: Diagnosis not present

## 2023-08-08 DIAGNOSIS — K6389 Other specified diseases of intestine: Secondary | ICD-10-CM

## 2023-08-08 LAB — CBC WITH DIFFERENTIAL/PLATELET
Abs Immature Granulocytes: 0.02 10*3/uL (ref 0.00–0.07)
Basophils Absolute: 0.1 10*3/uL (ref 0.0–0.1)
Basophils Relative: 1 %
Eosinophils Absolute: 0.1 10*3/uL (ref 0.0–0.5)
Eosinophils Relative: 1 %
HCT: 42.4 % (ref 36.0–46.0)
Hemoglobin: 14.4 g/dL (ref 12.0–15.0)
Immature Granulocytes: 0 %
Lymphocytes Relative: 19 %
Lymphs Abs: 1.8 10*3/uL (ref 0.7–4.0)
MCH: 31.9 pg (ref 26.0–34.0)
MCHC: 34 g/dL (ref 30.0–36.0)
MCV: 93.8 fL (ref 80.0–100.0)
Monocytes Absolute: 0.8 10*3/uL (ref 0.1–1.0)
Monocytes Relative: 8 %
Neutro Abs: 6.7 10*3/uL (ref 1.7–7.7)
Neutrophils Relative %: 71 %
Platelets: 322 10*3/uL (ref 150–400)
RBC: 4.52 MIL/uL (ref 3.87–5.11)
RDW: 12.7 % (ref 11.5–15.5)
WBC: 9.4 10*3/uL (ref 4.0–10.5)
nRBC: 0 % (ref 0.0–0.2)

## 2023-08-08 LAB — COMPREHENSIVE METABOLIC PANEL WITH GFR
ALT: 13 U/L (ref 0–44)
AST: 18 U/L (ref 15–41)
Albumin: 4.3 g/dL (ref 3.5–5.0)
Alkaline Phosphatase: 80 U/L (ref 38–126)
Anion gap: 13 (ref 5–15)
BUN: 12 mg/dL (ref 6–20)
CO2: 25 mmol/L (ref 22–32)
Calcium: 9.5 mg/dL (ref 8.9–10.3)
Chloride: 102 mmol/L (ref 98–111)
Creatinine, Ser: 1.14 mg/dL — ABNORMAL HIGH (ref 0.44–1.00)
GFR, Estimated: 56 mL/min — ABNORMAL LOW (ref >60)
Glucose, Bld: 119 mg/dL — ABNORMAL HIGH (ref 70–99)
Potassium: 4.2 mmol/L (ref 3.5–5.1)
Sodium: 140 mmol/L (ref 135–145)
Total Bilirubin: 0.4 mg/dL (ref 0.0–1.2)
Total Protein: 6.8 g/dL (ref 6.5–8.1)

## 2023-08-08 MED ORDER — IOHEXOL 300 MG/ML  SOLN
125.0000 mL | Freq: Once | INTRAMUSCULAR | Status: AC | PRN
  Administered 2023-08-08: 125 mL via INTRAVENOUS

## 2023-08-08 MED ORDER — OXYCODONE HCL 5 MG PO TABS: 5.0000 mg | ORAL_TABLET | ORAL | 0 refills | Status: DC | PRN

## 2023-08-08 MED ORDER — LIDOCAINE 4 % EX GEL: 1.0000 [in_us] | Freq: Four times a day (QID) | CUTANEOUS | 0 refills | Status: DC | PRN

## 2023-08-08 MED ORDER — NITROGLYCERIN 0.4 % RE OINT: 1.0000 [in_us] | TOPICAL_OINTMENT | Freq: Two times a day (BID) | RECTAL | 0 refills | Status: DC

## 2023-08-08 MED ORDER — MORPHINE SULFATE (PF) 4 MG/ML IV SOLN
4.0000 mg | Freq: Once | INTRAVENOUS | Status: AC
  Administered 2023-08-08: 4 mg via INTRAVENOUS
  Filled 2023-08-08: qty 1

## 2023-08-08 MED ORDER — ONDANSETRON HCL 4 MG/2ML IJ SOLN
4.0000 mg | Freq: Once | INTRAMUSCULAR | Status: AC
  Administered 2023-08-08: 4 mg via INTRAVENOUS
  Filled 2023-08-08: qty 2

## 2023-08-08 NOTE — Telephone Encounter (Addendum)
 Pt was referred to [facility], may take several days to be contacted.

## 2023-08-08 NOTE — Telephone Encounter (Signed)
 FYI Only or Action Required?: FYI only for provider  Patient was last seen in primary care on 08/07/2023 by Everlina Hock, NP. Called Nurse Triage reporting rectal pain. Symptoms began several days ago. Interventions attempted: Prescription medications: hydrocortisone 2.5% and lidocaine. Symptoms are: unchanged.  Triage Disposition: See HCP Within 4 Hours (Or PCP Triage)  Patient/caregiver understands and will follow disposition?: Unsure, pt disconnected call after was told UC.  Copied from CRM [id number]. Topic: Clinical - Red Word Triage >> Aug 08, 2023  4:27 PM Jim Motts C wrote: Red Word that prompted transfer to Nurse Triage: Excruiating pain from hemorrhoid Reason for Disposition  SEVERE rectal pain (e.g., excruciating, unable to have a bowel movement)  Answer Assessment - Initial Assessment Questions 1. SYMPTOM:  "What's the main symptom you're concerned about?" (e.g., pain, itching, swelling, rash)     Pain on outside of rectum 2. ONSET: "When did the rectal pain  start?"     4 3. RECTAL PAIN: "Do you have any pain around your rectum?" "How bad is the pain?"  (Scale 0-10; or mild, moderate, severe)   - NONE (0): no pain   - MILD (1-3): doesn't interfere with normal activities    - MODERATE (4-7): interferes with normal activities or awakens from sleep, limping    - SEVERE (8-10): excruciating pain, unable to have a bowel movement      10 4. RECTAL ITCHING: "Do you have any itching in this area?" "How bad is the itching?"  (Scale 0-10; or mild, moderate, severe)   - NONE: no itching   - MILD: doesn't interfere with normal activities    - MODERATE-SEVERE: interferes with normal activities or awakens from sleep     Mild itching, more burning and pain 5. CONSTIPATION: "Do you have constipation?" If Yes, ask: "How bad is it?"     denies 6. CAUSE: "What do you think is causing the anus symptoms?"     Hemorrhoids dx yesterday 7. OTHER SYMPTOMS: "Do you have any other symptoms?"  (e.g.,  abdomen pain, fever, rectal bleeding, vomiting)     denies  Protocols used: Rectal Symptoms-A-AH

## 2023-08-08 NOTE — ED Triage Notes (Signed)
 Pt c/o rectal pain d/t hemorrhoids x 1 wk; was seen by PCP yesterday; meds are not helping

## 2023-08-08 NOTE — ED Notes (Signed)
 ED Provider at bedside.

## 2023-08-08 NOTE — ED Provider Notes (Signed)
 [place] [facility] Provider Note   CSN: [id number] Arrival date & time: 08/08/23  1726     History  Chief Complaint  Patient presents with   Hemorrhoids    Shelley Edwards is a 58 y.o. female.  HPI    58yo female with history of atrial flutter/fibrillation on eliquis , who presents with concern for rectal pain.  She was diagnosed with hemorrhoids as an outpatient and began taking hydrocortisone , lidocaine , without relief.    Progressively rectal pain worsening since Wednesday Saturday had some bright red blood with bowel movement  No constipation, no diarrhea, stool has been soft Chills, no fever Saturday and today had nausea, no vomiting No urinary symptoms Pain is severe 10/10, constant pain, worse with BM but is present all the time.      Past Medical History:  Diagnosis Date   Abnormal uterine bleeding 07/2015   Acute meniscal tear, medial    left   H/O atrial flutter    Headache(784.0)      Home Medications Prior to Admission medications   Medication Sig Start Date End Date Taking? Authorizing Provider  Nitroglycerin  0.4 % OINT Place 1 inch rectally every 12 (twelve) hours for 21 days. 08/08/23 08/29/23 Yes Scarlette Currier, MD  oxyCODONE  (ROXICODONE ) 5 MG immediate release tablet Take 1 tablet (5 mg total) by mouth every 4 (four) hours as needed for severe pain (pain score 7-10) or breakthrough pain. 08/08/23  Yes Scarlette Currier, MD  apixaban  (ELIQUIS ) 5 MG TABS tablet TAKE 1 TABLET(5 MG) BY MOUTH TWICE DAILY 01/01/23   Tammie Fall, MD  flecainide  (TAMBOCOR ) 100 MG tablet Take 1 tablet (100 mg total) by mouth 2 (two) times daily. 04/09/23   Fenton, Clint R, PA  hydrocortisone  (ANUSOL -HC) 2.5 % rectal cream Place 1 Application rectally 2 (two) times daily. 08/07/23   Everlina Hock, NP  lidocaine  (XYLOCAINE ) 5 % ointment Apply 1 Application topically as needed. 08/09/23   Haviland, Julie, MD  metoprolol  succinate (TOPROL -XL) 100  MG 24 hr tablet Take 0.5 tablets (50 mg total) by mouth in the morning and at bedtime. Take with or immediately following a meal. 04/09/23   Fenton, Clint R, PA      Allergies    Patient has no known allergies.    Review of Systems   Review of Systems  Physical Exam Updated Vital Signs BP (!) 108/46   Pulse (!) 50   Temp 98 F (36.7 C)   Resp 15   Ht 5\' 9"  (1.753 m)   Wt 108 kg   LMP  (LMP Unknown) Comment: irregular menses  SpO2 100%   BMI 35.15 kg/m  Physical Exam Vitals and nursing note reviewed.  Constitutional:      General: She is not in acute distress.    Appearance: She is well-developed. She is not diaphoretic.  HENT:     Head: Normocephalic and atraumatic.  Eyes:     Conjunctiva/sclera: Conjunctivae normal.  Cardiovascular:     Rate and Rhythm: Normal rate and regular rhythm.     Heart sounds: Normal heart sounds. No murmur heard.    No friction rub. No gallop.  Pulmonary:     Effort: Pulmonary effort is normal. No respiratory distress.     Breath sounds: Normal breath sounds. No wheezing or rales.  Abdominal:     General: There is no distension.     Palpations: Abdomen is soft.     Tenderness: There is no abdominal  tenderness. There is no guarding.  Genitourinary:    Rectum: External hemorrhoid (not thrombosed) present. Abnormal anal tone (smasm, consider anal fissure but ont seen externally).  Musculoskeletal:        General: No tenderness.     Cervical back: Normal range of motion.  Skin:    General: Skin is warm and dry.     Findings: No erythema or rash.  Neurological:     Mental Status: She is alert and oriented to person, place, and time.     ED Results / Procedures / Treatments   Labs (all labs ordered are listed, but only abnormal results are displayed) Labs Reviewed  COMPREHENSIVE METABOLIC PANEL WITH GFR - Abnormal; Notable for the following components:      Result Value   Glucose, Bld 119 (*)    Creatinine, Ser 1.14 (*)    GFR,  Estimated 56 (*)    All other components within normal limits  CBC WITH DIFFERENTIAL/PLATELET    EKG None  Radiology No results found.  Procedures Procedures    Medications Ordered in ED Medications  morphine  (PF) 4 MG/ML injection 4 mg (4 mg Intravenous Given 08/08/23 1943)  ondansetron  (ZOFRAN ) injection 4 mg (4 mg Intravenous Given 08/08/23 1943)  iohexol  (OMNIPAQUE ) 300 MG/ML solution 125 mL (125 mLs Intravenous Contrast Given 08/08/23 2048)    ED Course/ Medical Decision Making/ A&P                                  58yo female with history of atrial flutter/fibrillation on eliquis , who presents with concern for rectal pain.  Given significant and severe pain on history and exam, labs and CT pelvis was ordered to evaluate for signs of perirectal abscess and showed no evidence of perirectal or perianal inflammatory changes, no CCT evidence of perirectal abscess or celluliits. Small stranding mid sigmoid which may be epiploic appendicitis, however she does not have abdominal pain.  Consider her pain may be secondary to anal fissure given severity, anal spasm. Hemrrhoids present also but not thrombosed.   Given severity of pain, discussed pain control.  Discussed that narcotics can cause constipation and exacerbation anal fissure/hemorrhoids and are generally not advised however at this time she has severe pain and will give short rx.  Given rx for nitroglycerin  rectal, lidocaine  to use primarily for pain.  Patient discharged in stable condition with understanding of reasons to return.  Recommend follow up with general surgery.        Final Clinical Impression(s) / ED Diagnoses Final diagnoses:  External hemorrhoids  Rectal pain  Anal sphincter spasm  Anal fissure  Epiploic appendagitis    Rx / DC Orders ED Discharge Orders          Ordered    Nitroglycerin  0.4 % OINT  Every 12 hours        08/08/23 2314    Lidocaine  4 % GEL  4 times daily PRN,   Status:  Discontinued         08/08/23 2314    oxyCODONE  (ROXICODONE ) 5 MG immediate release tablet  Every 4 hours PRN        08/08/23 2316              Scarlette Currier, MD 08/11/23 0030

## 2023-08-09 ENCOUNTER — Telehealth: Payer: Self-pay

## 2023-08-09 MED ORDER — LIDOCAINE 5 % EX OINT: 1.0000 | TOPICAL_OINTMENT | CUTANEOUS | 0 refills | Status: DC | PRN

## 2023-08-09 NOTE — Telephone Encounter (Signed)
 Copied from CRM [id number]. Topic: General - Other >> Aug 09, 2023 11:43 AM Alyse July wrote: Reason for CRM: Patient would like a call back to discuss recent ER visit and patient has additional questions and also would like to inform provider of ER detail. [phone].

## 2023-08-09 NOTE — Telephone Encounter (Signed)
Waiting for notes.

## 2023-08-09 NOTE — Telephone Encounter (Signed)
 Please advise? EDP notes not completed.

## 2023-08-10 NOTE — Telephone Encounter (Signed)
 Pt also scheduled w/ general surgery on 08/13/23.

## 2023-08-13 ENCOUNTER — Encounter: Payer: Self-pay | Admitting: Internal Medicine

## 2023-08-13 DIAGNOSIS — K600 Acute anal fissure: Secondary | ICD-10-CM | POA: Diagnosis not present

## 2023-08-13 NOTE — Telephone Encounter (Signed)
 Copied from CRM [id number]. Topic: General - Other >> Aug 13, 2023 10:59 AM Bambi Bonine D wrote: Reason for CRM:  Patient would like a call back today to discuss recent ER visit, Surgeon visit and patient has additional questions and also would like to inform provider of ER detail. [phone].

## 2023-08-13 NOTE — Telephone Encounter (Signed)
 Pt has appt tomorrow, 6/10- PCP will discuss during that time.

## 2023-08-13 NOTE — Telephone Encounter (Signed)
 Labs okay. CT pelvis: No perirectal inflammatory changes. Some focal fat lower stranding,  may represent epiploic appendagitis. Will send patient a message

## 2023-08-14 ENCOUNTER — Encounter: Payer: Self-pay | Admitting: Internal Medicine

## 2023-08-14 ENCOUNTER — Telehealth (INDEPENDENT_AMBULATORY_CARE_PROVIDER_SITE_OTHER): Admitting: Internal Medicine

## 2023-08-14 VITALS — Ht 69.0 in | Wt 232.0 lb

## 2023-08-14 DIAGNOSIS — K602 Anal fissure, unspecified: Secondary | ICD-10-CM

## 2023-08-14 DIAGNOSIS — K6289 Other specified diseases of anus and rectum: Secondary | ICD-10-CM

## 2023-08-14 DIAGNOSIS — K644 Residual hemorrhoidal skin tags: Secondary | ICD-10-CM

## 2023-08-14 NOTE — Telephone Encounter (Signed)
Pt has appt today w/ PCP.  °

## 2023-08-14 NOTE — Progress Notes (Unsigned)
 Subjective:    Patient ID: Shelley Edwards, female    DOB: 03-25-1965, 58 y.o.   MRN: [id number]  DOS:  08/14/2023 Type of visit - description: Virtual Visit via Video Note  I connected with the above patient  by a video enabled telemedicine application and verified that I am speaking with the correct person using two identifiers.   Location of patient: home  Location of provider: office  Persons participating in the virtual visit: patient, provider   I discussed the limitations of evaluation and management by telemedicine and the availability of in person appointments. The patient expressed understanding and agreed to proceed.  Acute Started with an rectal pain 08/06/2023. Was seen at this office, subsequently   went to the ER and yesterday to General Surgery. Dx anal fissure. Surgery recommend to continue nitroglycerin  ointment. She is calling because the pain is so intense that she feels exhausted and would like some days off to take care of this problem. She is anticoagulated, denies bleeding (other than w/ BMs)    Review of Systems See above   Past Medical History:  Diagnosis Date   Abnormal uterine bleeding 07/2015   Acute meniscal tear, medial    left   H/O atrial flutter    Headache(784.0)     Past Surgical History:  Procedure Laterality Date   A-FLUTTER ABLATION N/A 09/20/2016   Procedure: A-Flutter Ablation;  Surgeon: Tammie Fall, MD;  Location: [facility];  Service: Cardiovascular;  Laterality: N/A;   APPENDECTOMY  at age 65   BACK SURGERY     CHOLECYSTECTOMY  ~2002   KNEE ARTHROSCOPY Left 07/27/2016   Procedure: LEFT KNEE ARTHROSCOPY WITH PARTIAL MEDIAL MENISCECTOMY;  Surgeon: Arnie Lao, MD;  Location: [facility] OR;  Service: Orthopedics;  Laterality: Left;   KNEE SURGERY      Current Outpatient Medications  Medication Instructions   apixaban  (ELIQUIS ) 5 MG TABS tablet TAKE 1 TABLET(5 MG) BY MOUTH TWICE DAILY   flecainide  (TAMBOCOR ) 100  mg, Oral, 2 times daily   hydrocortisone  (ANUSOL -HC) 2.5 % rectal cream 1 Application, Rectal, 2 times daily   lidocaine  (XYLOCAINE ) 5 % ointment 1 Application, Topical, As needed   metoprolol  succinate (TOPROL -XL) 50 mg, Oral, 2 times daily, Take with or immediately following a meal.   Nitroglycerin  0.4 % OINT 1 inch, Rectal, Every 12 hours   oxyCODONE  (ROXICODONE ) 5 mg, Oral, Every 4 hours PRN       Objective:   Physical Exam Ht 5' 9 (1.753 m)   Wt 232 lb (105.2 kg)   LMP  (LMP Unknown) Comment: irregular menses  BMI 34.26 kg/m  This is virtual video protocol, alert oriented x 3.  In no physical or emotional distress.  Does seems to be somewhat frustrated.  No vital signs available    Assessment   ASSESSMENT P- A fib, ablation 09/2016  Pre monopausal , LMP ~ 07-2015 Morbid obesity  PLAN Anal fissure: The patient has rectal pain, severe, worse with BMs.  She is anticoagulated but only has a small amount of bleeding with BMs. Went to see surgery yesterday , rx to continue nitroglycerin  ointment prescribed at the ER, she reports she is intolerant to lidocaine  ointment (has tried multiple times and it caused severe burning). She request to be off work from June 2 to June 16. She will send appropriate paperwork. If pain continues or is severe she plans to reach out to general surgery. I also encouraged to come back for  routine checkup at her convenience   I discussed the assessment and treatment plan with the patient. The patient was provided an opportunity to ask questions and all were answered. The patient agreed with the plan and demonstrated an understanding of the instructions.   The patient was advised to call back or seek an in-person evaluation if the symptoms worsen or if the condition fails to improve as anticipated.

## 2023-08-15 ENCOUNTER — Telehealth: Payer: Self-pay

## 2023-08-15 DIAGNOSIS — Z0279 Encounter for issue of other medical certificate: Secondary | ICD-10-CM

## 2023-08-15 NOTE — Telephone Encounter (Signed)
 ST disability forms received from [facility], completed and placed in PCP red folder to be signed.

## 2023-08-15 NOTE — Assessment & Plan Note (Signed)
 Anal fissure: The patient has rectal pain, severe, worse with BMs.  She is anticoagulated but only has a small amount of bleeding with BMs. Went to see surgery yesterday , rx to continue nitroglycerin  ointment prescribed at the ER, she reports she is intolerant to lidocaine  ointment (has tried multiple times and it caused severe burning). She request to be off work from June 2 to June 16. She will send appropriate paperwork. If pain continues or is severe she plans to reach out to general surgery. I also encouraged to come back for routine checkup at her convenience

## 2023-08-17 ENCOUNTER — Encounter: Payer: Self-pay | Admitting: Internal Medicine

## 2023-08-17 ENCOUNTER — Ambulatory Visit (INDEPENDENT_AMBULATORY_CARE_PROVIDER_SITE_OTHER): Admitting: Internal Medicine

## 2023-08-17 VITALS — BP 108/62 | HR 58 | Temp 98.0°F | Resp 16 | Ht 69.0 in | Wt 231.5 lb

## 2023-08-17 DIAGNOSIS — E559 Vitamin D deficiency, unspecified: Secondary | ICD-10-CM

## 2023-08-17 DIAGNOSIS — I10 Essential (primary) hypertension: Secondary | ICD-10-CM

## 2023-08-17 DIAGNOSIS — K602 Anal fissure, unspecified: Secondary | ICD-10-CM | POA: Diagnosis not present

## 2023-08-17 DIAGNOSIS — Z0000 Encounter for general adult medical examination without abnormal findings: Secondary | ICD-10-CM | POA: Diagnosis not present

## 2023-08-17 DIAGNOSIS — Z0279 Encounter for issue of other medical certificate: Secondary | ICD-10-CM

## 2023-08-17 DIAGNOSIS — Z0001 Encounter for general adult medical examination with abnormal findings: Secondary | ICD-10-CM | POA: Diagnosis not present

## 2023-08-17 DIAGNOSIS — E785 Hyperlipidemia, unspecified: Secondary | ICD-10-CM

## 2023-08-17 DIAGNOSIS — Z2821 Immunization not carried out because of patient refusal: Secondary | ICD-10-CM

## 2023-08-17 DIAGNOSIS — Z5320 Procedure and treatment not carried out because of patient's decision for unspecified reasons: Secondary | ICD-10-CM

## 2023-08-17 NOTE — Patient Instructions (Addendum)
 Vaccines I recommend Tetanus shot Flu shot every fall COVID booster Pneumonia shot.  Procedures I recommend: A mammogram yearly See a gynecologist regularly A bone density test  Once you feel better, proceed with a colonoscopy, let me know when  ready and I will help you arrange it.     GO TO THE LAB :  Get the blood work   Your results will be posted on MyChart with my comments  Next office visit for a physical exam in 1 year Please make an appointment before you leave today

## 2023-08-17 NOTE — Telephone Encounter (Signed)
 Paperwork updated for Pt to return to work on 08/27/23, form faxed back to [employer]. Received fax confirmation.

## 2023-08-17 NOTE — Progress Notes (Unsigned)
 Subjective:    Patient ID: Shelley Edwards, female    DOB: 1965/11/19, 58 y.o.   MRN: [id number]  DOS:  08/17/2023 Type of visit - description: CPX  Here for CPX. Chronic medical problems discussed. She continues to have severe pain due to an anal fissure. Minimal bleeding.  Denies nausea vomiting. No major problems with bleeding from the anal fissure. No LUTS.  Wt Readings from Last 3 Encounters:  08/17/23 231 lb 8 oz (105 kg)  08/14/23 232 lb (105.2 kg)  08/08/23 238 lb (108 kg)    Review of Systems See above   Past Medical History:  Diagnosis Date   Abnormal uterine bleeding 07/2015   Acute meniscal tear, medial    left   H/O atrial flutter    Headache(784.0)     Past Surgical History:  Procedure Laterality Date   A-FLUTTER ABLATION N/A 09/20/2016   Procedure: A-Flutter Ablation;  Surgeon: Tammie Fall, MD;  Location: [facility];  Service: Cardiovascular;  Laterality: N/A;   APPENDECTOMY  at age 31   BACK SURGERY     CHOLECYSTECTOMY  ~2002   KNEE ARTHROSCOPY Left 07/27/2016   Procedure: LEFT KNEE ARTHROSCOPY WITH PARTIAL MEDIAL MENISCECTOMY;  Surgeon: Arnie Lao, MD;  Location: [facility] OR;  Service: Orthopedics;  Laterality: Left;   KNEE SURGERY      Current Outpatient Medications  Medication Instructions   apixaban  (ELIQUIS ) 5 MG TABS tablet TAKE 1 TABLET(5 MG) BY MOUTH TWICE DAILY   flecainide  (TAMBOCOR ) 100 mg, Oral, 2 times daily   hydrocortisone  (ANUSOL -HC) 2.5 % rectal cream 1 Application, Rectal, 2 times daily   lidocaine  (XYLOCAINE ) 5 % ointment 1 Application, Topical, As needed   metoprolol  succinate (TOPROL -XL) 50 mg, Oral, 2 times daily, Take with or immediately following a meal.   Nitroglycerin  0.4 % OINT 1 inch, Rectal, Every 12 hours   oxyCODONE  (ROXICODONE ) 5 mg, Oral, Every 4 hours PRN       Objective:   Physical Exam BP 108/62   Pulse (!) 58   Temp 98 F (36.7 C) (Oral)   Resp 16   Ht 5' 9 (1.753 m)   Wt 231 lb 8 oz  (105 kg)   LMP  (LMP Unknown) Comment: irregular menses  SpO2 98%   BMI 34.19 kg/m  General: Well developed, NAD, BMI noted.  Seems uncomfortable due to anal fissure Neck: No  thyromegaly  HEENT:  Normocephalic . Face symmetric, atraumatic Lungs:  CTA B Normal respiratory effort, no intercostal retractions, no accessory muscle use. Heart: RRR,  no murmur.  Abdomen:  Not distended, soft, non-tender. No rebound or rigidity.   Lower extremities: no pretibial edema bilaterally  Skin: Exposed areas without rash. Not pale. Not jaundice Neurologic:  alert & oriented X3.  Speech normal, gait appropriate for age and unassisted Strength symmetric and appropriate for age.  Psych: Cognition and judgment appear intact.  Cooperative with normal attention span and concentration.  Behavior appropriate. No anxious or depressed appearing.     Assessment     ASSESSMENT P- A fib, ablation 09/2016.  04/2023 monitor: 100% atrial flutter Menopausal  LMP ~ 2017 Obesity  PLAN Here for CPX Vaccines: Tdap, flu shot, COVID booster, PNM 20 (history of A-fib). Prons>>cons.  Strongly declined at Female care: MMGs: had one  in her life time, declined a referral  MMG, explained benefits of early Ca detection Gyn eval:declined a  referral, states she will arrange her own visit . CCS: Never had  a colonoscopy, currently has a anal fissure, rec a cscope once is better  Labs: Reviewed, will get FLP A1c and vitamin D  Bones: menopausal rec DEXA, declines    Other issues addressed today. A-fib: Saw neurology 04/09/2023, a monitor show with 100% atrial flutter burden. Propafenone  was stopped and started flecainide  100 mg twice a day, continue Eliquis , continue metoprolol . Anal fissure: Not improving, FMLA extended to June 23, recommend to continue discussing options with general surgery.  Colonoscopy once she is better. Vitamin D  deficiency: Reluctant to take supplements, I did recommend vitamin D  2000 units  every day.  Avoid calcium  supplements due to s/e  constipation.  Checking levels.  Further about results. RTC 1 year

## 2023-08-17 NOTE — Telephone Encounter (Signed)
 Received fax confirmation

## 2023-08-17 NOTE — Telephone Encounter (Signed)
 Completed form faxed back to [employer] at [phone], form sent for scanning.

## 2023-08-18 ENCOUNTER — Encounter: Payer: Self-pay | Admitting: Internal Medicine

## 2023-08-18 LAB — VITAMIN D 25 HYDROXY (VIT D DEFICIENCY, FRACTURES): Vit D, 25-Hydroxy: 33 ng/mL (ref 30–100)

## 2023-08-18 LAB — HEMOGLOBIN A1C
Hgb A1c MFr Bld: 5.7 % — ABNORMAL HIGH (ref <5.7)
Mean Plasma Glucose: 117 mg/dL
eAG (mmol/L): 6.5 mmol/L

## 2023-08-18 LAB — LIPID PANEL
Cholesterol: 266 mg/dL — ABNORMAL HIGH (ref <200)
HDL: 37 mg/dL — ABNORMAL LOW (ref >=50)
LDL Cholesterol (Calc): 192 mg/dL — ABNORMAL HIGH
Non-HDL Cholesterol (Calc): 229 mg/dL — ABNORMAL HIGH (ref <130)
Total CHOL/HDL Ratio: 7.2 (calc) — ABNORMAL HIGH (ref <5.0)
Triglycerides: 195 mg/dL — ABNORMAL HIGH (ref <150)

## 2023-08-18 NOTE — Assessment & Plan Note (Signed)
 Here for CPX    Other issues addressed today. A-fib: Saw cards 04/09/2023, a monitor show with 100% atrial flutter burden. Propafenone  was stopped and started flecainide  100 mg twice a day, continue Eliquis , continue metoprolol . Anal fissure: Not improving, FMLA extended to June 23, recommend to continue discussing options with general surgery.  Colonoscopy once she is better. Vitamin D  deficiency: Reluctant to take supplements, I did recommend vitamin D  2000 units every day.  Avoid calcium  supplements due to s/e  constipation.  Checking levels.  Further about results. RTC 1 year

## 2023-08-18 NOTE — Assessment & Plan Note (Signed)
 Here for CPX Vaccines: Tdap, flu shot, COVID booster, PNM 20 (history of A-fib). Prons>>cons.  Strongly declined all Female care: MMGs: had one  in her life time, declined a referral  MMG, explained benefits of early Ca detection Gyn eval:declined a  referral, states she will arrange her own visit . CCS: Never had a colonoscopy, currently has a anal fissure, rec a cscope once is better  Labs: Reviewed, will get FLP A1c and vitamin D  Bones: menopausal rec DEXA, declines

## 2023-08-20 ENCOUNTER — Ambulatory Visit: Payer: Self-pay | Admitting: Internal Medicine

## 2023-08-20 DIAGNOSIS — E785 Hyperlipidemia, unspecified: Secondary | ICD-10-CM

## 2023-08-20 MED ORDER — ATORVASTATIN CALCIUM 40 MG PO TABS: 40.0000 mg | ORAL_TABLET | Freq: Every day | ORAL | 0 refills | Status: DC

## 2023-08-29 MED ORDER — NITROGLYCERIN 0.4 % RE OINT: 1.0000 [in_us] | TOPICAL_OINTMENT | Freq: Two times a day (BID) | RECTAL | 1 refills | Status: AC | PRN

## 2023-08-29 NOTE — Addendum Note (Signed)
 Addended by: Marcelus Dubberly D on: 08/29/2023 01:00 PM   Modules accepted: Orders

## 2023-10-01 ENCOUNTER — Other Ambulatory Visit (HOSPITAL_COMMUNITY): Payer: Self-pay

## 2023-10-01 MED ORDER — FLECAINIDE ACETATE 100 MG PO TABS: 100.0000 mg | ORAL_TABLET | Freq: Two times a day (BID) | ORAL | 0 refills | Status: DC

## 2023-10-10 ENCOUNTER — Other Ambulatory Visit: Payer: Self-pay | Admitting: Internal Medicine

## 2023-10-10 DIAGNOSIS — I480 Paroxysmal atrial fibrillation: Secondary | ICD-10-CM

## 2023-10-10 NOTE — Telephone Encounter (Signed)
 Prescription refill request for Eliquis  received. Indication: Afib  Last office visit: 04/09/23 Jeanette)  Scr: 1.14 (08/08/23)  Age: 58 Weight: 105kg  Appropriate dose. Refill sent.

## 2023-11-02 DIAGNOSIS — Z1272 Encounter for screening for malignant neoplasm of vagina: Secondary | ICD-10-CM

## 2023-11-02 DIAGNOSIS — Z1231 Encounter for screening mammogram for malignant neoplasm of breast: Secondary | ICD-10-CM

## 2023-11-05 ENCOUNTER — Other Ambulatory Visit: Payer: Self-pay | Admitting: Internal Medicine

## 2023-11-06 ENCOUNTER — Telehealth: Payer: Self-pay

## 2023-11-06 ENCOUNTER — Ambulatory Visit: Payer: Self-pay | Admitting: General Surgery

## 2023-11-06 DIAGNOSIS — K602 Anal fissure, unspecified: Secondary | ICD-10-CM

## 2023-11-06 DIAGNOSIS — K601 Chronic anal fissure: Secondary | ICD-10-CM | POA: Diagnosis not present

## 2023-11-06 MED ORDER — ONABOTULINUMTOXINA 100 UNITS IJ SOLR: 100.0000 [IU] | INTRAMUSCULAR | Status: AC

## 2023-11-06 NOTE — Telephone Encounter (Signed)
   Pre-operative Risk Assessment    Patient Name: Shelley Edwards  DOB: 08-25-1965 MRN: [id number]   Date of last office visit: 01/19/23 RENEE URSUY, PA-C Date of next office visit: NONE   Request for Surgical Clearance    Procedure:  CHEMICAL SPHINCTEROTOMY  Date of Surgery:  Clearance TBD                                Surgeon:  BERNARDA NED, MD Surgeon's Group or Practice Name:  [facility] Phone number:  [phone] Fax number:  [phone]  ATTN: BURNARD CRETE, LPN   Type of Clearance Requested:   - Medical  - Pharmacy:  Hold Apixaban  (Eliquis )     Type of Anesthesia:  General    Additional requests/questions:    Signed, Lucie DELENA Ku   11/06/2023, 4:25 PM

## 2023-11-06 NOTE — H&P (Signed)
 PROVIDER:  Breydon Senters CHRISTINE Felica Chargois, MD  MRN: [id number] DOB: 05/30/1965 DATE OF ENCOUNTER: 11/06/2023  Subjective   Chief Complaint: RE-CHECK - 8wk reck- hems/anal fissure     History of Present Illness: Shelley Edwards is a 58 y.o. female who is seen today as an office consultation at the request of NP Self for evaluation of RE-CHECK - 8wk reck- hems/anal fissure .  Patient recently seen in the emergency department with rectal pain.  She has noticed some blood with bowel movements as well.  Pain is severe with bowel movements.  She was thought to have an anal fissure and was given nitroglycerin  cream as well as lidocaine  and oxycodone .  She reports the lidocaine  ointment and oxycodone  did not work well for her.  She is using sitz bath's, but is unable to sit for long periods of time due to pain.  She is using the nitroglycerin  ointment and has had some relief of her symptoms with this.  She does have some headaches associated with this. Patient currently anticoagulated on Eliquis  due to atrial fibs/flutter.  Review of Systems: A complete review of systems was obtained from the patient.  I have reviewed this information and discussed as appropriate with the patient.  See HPI as well for other ROS.     Medical History: Past Medical History:  Diagnosis Date   Arrhythmia    Hypertension     There is no problem list on file for this patient.   Past Surgical History:  Procedure Laterality Date   CHOLECYSTECTOMY     JOINT REPLACEMENT     SPINE SURGERY       No Known Allergies  Current Outpatient Medications on File Prior to Visit  Medication Sig Dispense Refill   apixaban  (ELIQUIS ) 5 mg tablet Take 5 mg by mouth 2 (two) times daily     flecainide  (TAMBOCOR ) 100 MG tablet Take 100 mg by mouth 2 (two) times daily     lidocaine  (UROJET/GLYDO) 2 % jelly in applicator APPLY 1 APPLICATION TOPICALLY EVERY DAY AS NEEDED TO THE AFFECTED AREA     metoprolol  SUCCinate (TOPROL -XL) 100 MG  XL tablet Take 50 mg by mouth     hydrocortisone  (ANUSOL -HC) 2.5 % rectal cream Place 1 Application rectally     lidocaine  4 % Gel Apply 1 inch topically     nitroglycerin  0.4 % (w/w) Oint APPLY 1 INCH RECTALLY EVERY 12 HOURS FOR 21 DAYS     No current facility-administered medications on file prior to visit.    History reviewed. No pertinent family history.   Social History   Tobacco Use  Smoking Status Never  Smokeless Tobacco Never     Social History   Socioeconomic History   Marital status: Divorced  Tobacco Use   Smoking status: Never   Smokeless tobacco: Never  Vaping Use   Vaping status: Never Used  Substance and Sexual Activity   Alcohol use: Yes    Alcohol/week: 0.0 - 1.0 standard drinks of alcohol   Drug use: Never   Social Drivers of Corporate investment banker Strain: Low Risk  (02/21/2023)   Received from [facility]   Overall Financial Resource Strain (CARDIA)    Difficulty of Paying Living Expenses: Not very hard  Food Insecurity: No Food Insecurity (02/21/2023)   Received from [facility]   Hunger Vital Sign    Within the past 12 months, you worried that your food would run out before you got the money to buy  more.: Never true    Within the past 12 months, the food you bought just didn't last and you didn't have money to get more.: Never true  Transportation Needs: No Transportation Needs (02/21/2023)   Received from [facility]    Lack of Transportation (Medical): No    Lack of Transportation (Non-Medical): No  Physical Activity: Sufficiently Active (02/21/2023)   Received from [facility]   Exercise Vital Sign    On average, how many days per week do you engage in moderate to strenuous exercise (like a brisk walk)?: 5 days    On average, how many minutes do you engage in exercise at this level?: 30 min  Stress: No Stress Concern Present (02/21/2023)   Received from [facility]  - Occupational Stress Questionnaire    Feeling of Stress : Only a little  Social Connections: Unknown (02/21/2023)   Received from [facility]   Social Connection and Isolation Panel    In a typical week, how many times do you talk on the phone with family, friends, or neighbors?: More than three times a week    How often do you get together with friends or relatives?: Once a week    How often do you attend church or religious services?: Patient declined    Do you belong to any clubs or organizations such as church groups, unions, fraternal or athletic groups, or school groups?: Patient declined    Are you married, widowed, divorced, separated, never married, or living with a partner?: Divorced  Housing Stability: Unknown (08/13/2023)   Housing Stability Vital Sign    Homeless in the Last Year: No    Objective:    Vitals:   11/06/23 1204  PainSc: 0-No pain     Exam Gen: NAD Abd: soft Rectal: Posterior midline anal fissure noted, significant tenderness noted around the area.   Labs, Imaging and Diagnostic Testing: ED and primary care notes reviewed  Assessment and Plan:  Diagnoses and all orders for this visit:  Chronic anal fissure     She has tried an 8-week course of nitroglycerin  and her fissure has not healed.  I recommend that she proceed to the operating room for chemical sphincterotomy.  We discussed that this is a temporary relaxation of the sphincter muscle to allow for the fissure to heal.  We discussed risk of recurrence and small risk of temporary incontinence.  All questions were answered.  Patient will proceed with surgery.      Bernarda JAYSON Ned, MD Colon and Rectal Surgery [facility]

## 2023-11-08 NOTE — Telephone Encounter (Signed)
   Name: Shelley Edwards  DOB: 01-08-1966  MRN: [id number]  Primary Cardiologist: Danelle Birmingham, MD   Preoperative team, please contact this patient and set up a phone call appointment for further preoperative risk assessment. Please obtain consent and complete medication review. Thank you for your help.  I confirm that guidance regarding antiplatelet and oral anticoagulation therapy has been completed and, if necessary, noted below.  Per Pharm D, patient has not had an Afib/aflutter ablation  or Watchman within the last 3 months or DCCV within the last 30 days. Patient may hold Eliquis  for 2 days prior to procedure.  Patient will not need bridging with Lovenox around procedure.    I also confirmed the patient resides in the state of [place] . As per [facility] [place] Medical Board telemedicine laws, the patient must reside in the state in which the provider is licensed.    Barnie Hila, NP 11/08/2023, 3:56 PM [facility]

## 2023-11-08 NOTE — Telephone Encounter (Signed)
 Patient with diagnosis of atrial flutter on Eliquis  for anticoagulation.    Procedure:  CHEMICAL SPHINCTEROTOMY   Date of Surgery:  Clearance TBD      CHA2DS2-VASc Score = 2   This indicates a 2.2% annual risk of stroke. The patient's score is based upon: CHF History: 0 HTN History: 1 Diabetes History: 0 Stroke History: 0 Vascular Disease History: 0 Age Score: 0 Gender Score: 1      CrCl 90  Platelet count 322  Patient has not had an Afib/aflutter ablation or Watchman within the last 3 months or DCCV within the last 30 days   Per office protocol, patient can hold Eliquis  for 2 days prior to procedure.   Patient will not need bridging with Lovenox (enoxaparin) around procedure.  **This guidance is not considered finalized until pre-operative APP has relayed final recommendations.**

## 2023-11-09 NOTE — Telephone Encounter (Signed)
 Left message for pt to call our office and ask for the preop team to schedule TELE Preop appt.

## 2023-11-14 ENCOUNTER — Telehealth: Payer: Self-pay

## 2023-11-14 NOTE — Telephone Encounter (Signed)
 Med Rec and Consent Done    Patient Consent for Virtual Visit        Shelley Edwards has provided verbal consent on 11/14/2023 for a virtual visit (video or telephone).   CONSENT FOR VIRTUAL VISIT FOR:  Shelley Edwards  By participating in this virtual visit I agree to the following:  I hereby voluntarily request, consent and authorize [facility] and its employed or contracted physicians, physician assistants, nurse practitioners or other licensed health care professionals (the Practitioner), to provide me with telemedicine health care services (the "Services) as deemed necessary by the treating Practitioner. I acknowledge and consent to receive the Services by the Practitioner via telemedicine. I understand that the telemedicine visit will involve communicating with the Practitioner through live audiovisual communication technology and the disclosure of certain medical information by electronic transmission. I acknowledge that I have been given the opportunity to request an in-person assessment or other available alternative prior to the telemedicine visit and am voluntarily participating in the telemedicine visit.  I understand that I have the right to withhold or withdraw my consent to the use of telemedicine in the course of my care at any time, without affecting my right to future care or treatment, and that the Practitioner or I may terminate the telemedicine visit at any time. I understand that I have the right to inspect all information obtained and/or recorded in the course of the telemedicine visit and may receive copies of available information for a reasonable fee.  I understand that some of the potential risks of receiving the Services via telemedicine include:  Delay or interruption in medical evaluation due to technological equipment failure or disruption; Information transmitted may not be sufficient (e.g. poor resolution of images) to allow for appropriate medical decision  making by the Practitioner; and/or  In rare instances, security protocols could fail, causing a breach of personal health information.  Furthermore, I acknowledge that it is my responsibility to provide information about my medical history, conditions and care that is complete and accurate to the best of my ability. I acknowledge that Practitioner's advice, recommendations, and/or decision may be based on factors not within their control, such as incomplete or inaccurate data provided by me or distortions of diagnostic images or specimens that may result from electronic transmissions. I understand that the practice of medicine is not an exact science and that Practitioner makes no warranties or guarantees regarding treatment outcomes. I acknowledge that a copy of this consent can be made available to me via my patient portal [facility] MyChart), or I can request a printed copy by calling the office of [facility].    I understand that my insurance will be billed for this visit.   I have read or had this consent read to me. I understand the contents of this consent, which adequately explains the benefits and risks of the Services being provided via telemedicine.  I have been provided ample opportunity to ask questions regarding this consent and the Services and have had my questions answered to my satisfaction. I give my informed consent for the services to be provided through the use of telemedicine in my medical care

## 2023-11-14 NOTE — Telephone Encounter (Signed)
 S/W pt and scheduled TELE preop appt 11/23/23.. Med Rec and Consent Done  Will update surgeons office.

## 2023-11-14 NOTE — Telephone Encounter (Signed)
 Patient returned Pre-Op call.

## 2023-11-23 ENCOUNTER — Ambulatory Visit: Attending: Cardiology | Admitting: Emergency Medicine

## 2023-11-23 DIAGNOSIS — Z01810 Encounter for preprocedural cardiovascular examination: Secondary | ICD-10-CM | POA: Diagnosis not present

## 2023-11-23 NOTE — Progress Notes (Signed)
 Virtual Visit via Telephone Note   Because of [name] co-morbid illnesses, she is at least at moderate risk for complications without adequate follow up.  This format is felt to be most appropriate for this patient at this time.  Due to technical limitations with video connection (technology), today's appointment will be conducted as an audio only telehealth visit, and [name] verbally agreed to proceed in this manner.   All issues noted in this document were discussed and addressed.  No physical exam could be performed with this format.  Evaluation Performed:  Preoperative cardiovascular risk assessment _____________   Date:  11/23/2023   Patient ID:  Shelley Edwards, DOB 08/12/1965, MRN [id number] Patient Location:  Home Provider location:   Office  Primary Care Provider:  Amon Aloysius BRAVO, MD Primary Cardiologist:  Danelle Birmingham, MD  Chief Complaint / Patient Profile   58 y.o. y/o female with a h/o HTN, paroxysmal AFib/flutter who is pending chemical sphincterotomy and presents today for telephonic preoperative cardiovascular risk assessment.  History of Present Illness    Shelley Edwards is a 58 y.o. female who presents via audio/video conferencing for a telehealth visit today.  Pt was last seen in cardiology clinic on 01/19/2023 by Charlies Arthur PA.  At that time Aune Adami was doing well.  The patient is now pending procedure as outlined above.   Since her last visit, she denies chest pain, dyspnea, pnd, orthopnea, n, v, dizziness, syncope, edema, weight gain, or early satiety. Does report occasional palpitations in regards to her atrial fibrillation, unchanged from prior visits. All other systems reviewed and are otherwise negative except as noted above.    Past Medical History    Past Medical History:  Diagnosis Date   Abnormal uterine bleeding 07/2015   Acute meniscal tear, medial    left   H/O atrial flutter    Headache(784.0)    Past Surgical History:  Procedure  Laterality Date   A-FLUTTER ABLATION N/A 09/20/2016   Procedure: A-Flutter Ablation;  Surgeon: Birmingham Danelle ORN, MD;  Location: [facility] INVASIVE CV LAB;  Service: Cardiovascular;  Laterality: N/A;   APPENDECTOMY  at age 37   BACK SURGERY     CHOLECYSTECTOMY  ~2002   KNEE ARTHROSCOPY Left 07/27/2016   Procedure: LEFT KNEE ARTHROSCOPY WITH PARTIAL MEDIAL MENISCECTOMY;  Surgeon: Vernetta Lonni GRADE, MD;  Location: [facility] OR;  Service: Orthopedics;  Laterality: Left;   KNEE SURGERY      Allergies  No Known Allergies  Home Medications    Prior to Admission medications   Medication Sig Start Date End Date Taking? Authorizing Provider  apixaban  (ELIQUIS ) 5 MG TABS tablet TAKE 1 TABLET(5 MG) BY MOUTH TWICE DAILY 10/10/23   Birmingham Danelle ORN, MD  flecainide  (TAMBOCOR ) 100 MG tablet Take 1 tablet (100 mg total) by mouth 2 (two) times daily. 10/01/23   Fenton, Clint R, PA  metoprolol  succinate (TOPROL -XL) 100 MG 24 hr tablet Take 0.5 tablets (50 mg total) by mouth in the morning and at bedtime. Take with or immediately following a meal 11/06/23   Paz, Jose E, MD  Nitroglycerin  0.4 % OINT Place 1 inch rectally every 12 (twelve) hours as needed. 08/29/23   Amon Aloysius BRAVO, MD    Physical Exam    Vital Signs:  Durga Saldarriaga does not have vital signs available for review today.  Given telephonic nature of communication, physical exam is limited. AAOx3. NAD. Normal affect.  Speech and respirations are unlabored.  Accessory Clinical Findings  None  Assessment & Plan    1.  Preoperative Cardiovascular Risk Assessment:  According to the Revised Cardiac Risk Index (RCRI), her Perioperative Risk of Major Cardiac Event is (%): 0.4  Her Functional Capacity in METs is: 7.93 according to the Duke Activity Status Index (DASI). Therefore, based on ACC/AHA guidelines, patient would be at acceptable risk for the planned procedure without further cardiovascular testing.   She is due for her routine follow up in clinic,  however this is not prohibitive of surgery considering patient is asymptomatic for any new cardiac issues. Will forward her information over to have appointment scheduled.  The patient was advised that if she develops new symptoms prior to surgery to contact our office to arrange for a follow-up visit, and she verbalized understanding.  Patient may hold Eliquis  for 2 days prior to procedure.   Patient will not need bridging with Lovenox around procedure  Please resume eliquis  as soon as possible postprocedure, at the discretion of the surgeon.   A copy of this note will be routed to requesting surgeon.  Time:   Today, I have spent 10 minutes with the patient with telehealth technology discussing medical history, symptoms, and management plan.     Vaishnav Demartin E Thoma Paulsen, NP  11/23/2023, 8:23 AM

## 2024-01-02 ENCOUNTER — Other Ambulatory Visit (HOSPITAL_COMMUNITY): Payer: Self-pay | Admitting: Physician Assistant

## 2024-01-29 ENCOUNTER — Ambulatory Visit (HOSPITAL_BASED_OUTPATIENT_CLINIC_OR_DEPARTMENT_OTHER): Admit: 2024-01-29 | Admitting: General Surgery

## 2024-01-29 ENCOUNTER — Encounter (HOSPITAL_BASED_OUTPATIENT_CLINIC_OR_DEPARTMENT_OTHER): Payer: Self-pay

## 2024-01-29 SURGERY — INJECTION, CHEMODENERVATION AGENT
Anesthesia: Monitor Anesthesia Care

## 2024-03-12 NOTE — Progress Notes (Signed)
 " Cardiology Office Note:  .   Date:  03/12/2024  ID:  Shelley Edwards, DOB [birth year], MRN [id number] PCP: Amon Aloysius BRAVO, MD  [facility] Providers Cardiologist:  Danelle Birmingham, MD {  History of Present Illness: .   Shelley Edwards is a 59 y.o. female w/PMHx of  HTN,  AFib/flutter  She last saw Dr. Birmingham 01/10/22, working on weight loss, considering Ozempic, recurrent palpitations, mostly AFlutter (rather then AFib)  I saw her 01/19/23 She is doing really well Since her divorce, she has started regular exercise with Yoga/Pilates, and with healthy eating lost 60+ pounds She is very active, travels quite a lot for work, and reports excellent exertional capacity No CP No SOB, DOE No near syncope or syncope No bleeding/signs of bleeding She had not had any palpitations all year, until last month, had about 2 weeks of intermittent/brief flutters, Lchest/heart seemed to feel them laterally towards her armpit No clear trigger, no missed medicines  She asks bout changing propafenone  back to flecainide  She reports she asked Dr. Birmingham to stop flecainide  because it made her have a very unusual sensation in her thighs But the propafenone  makes her feel more fatigued/just not as well. Planned: Will get an echo and monitor prior to making any medications changes She asks about retrying flecainide , though stopped due to side effects Await testing for final recommendations Since she has lost quite a bit of weight and exercising regularly, perhaps consider seeing how she does off AAD  Preserved LVEF 60-65%, mild-mod TR Monitor was 100% AFlutter  She aw the AFib clinic 04/09/23, she was unaware of the AFlutter/rhythm while wearing the monitor Propafenone  stopped > retried flecainide , eliquis , metoprolol  continued  04/30/23 F/u EKG, SB 54bpm, stable intervals  Today's visit is scheduled as an annual visit/revisit HRs/kardia tracings ROS:   She got pretty sick over the holidays and had some  palpitations/arrhythmia Otherwise has done well She has had a couple HRs 47 or so, most often 50's-60's, that while she feels fine, make her a little worried may be too slow. No CP, SOB No dizzy spells, near syncope or syncope No bleeding or signs of bleeding  Arrhythmia/AAD hx CTI ablation 09/20/2016 In review of Dr. Adrian note, has also been seen to have AFib, though mostly flutter (post ablation) Flecainide  stopped 2021 Propafenone  started 2021 > stopped with recurrent arrhythmia Feb 2025 Restarted flecainide  Feb 2025  Studies Reviewed: SABRA    EKG done today and reviewed by myself:  SB 54bpm, PAC, PR , QRS 99ms, QTc Stable intervals  Dec 2024: monitor (100%) Atrial fib/flutter with a slow VR(55/min), CVR (ave 89/min) and RVR (191/min) No prolonged pauses No symptoms submitted.  03/02/23: TTE 1. Left ventricular ejection fraction, by estimation, is 60 to 65%. The  left ventricle has normal function. The left ventricle has no regional  wall motion abnormalities. Left ventricular diastolic parameters are  indeterminate.   2. Right ventricular systolic function is normal. The right ventricular  size is normal.   3. The mitral valve is normal in structure. Trivial mitral valve  regurgitation. No evidence of mitral stenosis.   4. Tricuspid valve regurgitation is mild to moderate.   5. The aortic valve is normal in structure. Aortic valve regurgitation is  not visualized. No aortic stenosis is present.   6. Aortic dilatation noted. There is mild dilatation of the ascending  aorta, measuring 40 mm.   7. The inferior vena cava is normal in size with greater than 50%  respiratory variability, suggesting right atrial pressure of 3 mmHg.    09/20/2016: EPS/ablation CONCLUSIONS:  1. Inducible isthmus dependent atrial flutter and paroxysmal atrial fibrillation.  2. Successful radiofrequency ablation of atrial flutter along the cavotricuspid isthmus with complete  bidirectional isthmus block achieved.  3. Spontaneous atrial fib requiring DCCV  4. No early apparent complications.    Risk Assessment/Calculations:    Physical Exam:   VS:  LMP  (LMP Unknown) Comment: irregular menses   Wt Readings from Last 3 Encounters:  08/17/23 231 lb 8 oz (105 kg)  08/14/23 232 lb (105.2 kg)  08/08/23 238 lb (108 kg)    GEN: Well nourished, well developed in no acute distress NECK: No JVD; No carotid bruits CARDIAC:  RRR, no murmurs, rubs, gallops RESPIRATORY:  CTA b/l without rales, wheezing or rhonchi  ABDOMEN: Soft, non-tender, non-distended EXTREMITIES:  No edema; No deformity   ASSESSMENT AND PLAN: .    Paroxysmal AFib/AFlutter CHA2DS2Vasc is one (HTN, 2 with gender), on Eliquis , appropriately dosed Flecainide  and metoprolol  w/stable intervals Low burden by symptoms Only able to pull up an old Kardia tracing from Nov 2025, SB 47 Asymptomatic bradycardia Discussed symptoms to make us  aware of, and if she starts to see HRs regularly in the 40's  Discussed today, new EP MD, will plan to transition to Dr. Almetta Discussed consideration of ablation She recalls Dr. Waddell telling her her flutter (and fib) was on the left side and was a more involved procedure. With her ablation she had years ago, told her heart had stopped, and had to be shocked back, woke up during her procedure, and was a bit complicated.  All that being said, she would consider ablation if burden were to increase, and or in effort to minimize medications  Plan EST back on flecainide  Plan to have her see Dr. Almetta next  HTN Looks ok  Secondary hypercoagulable state 2/2 AFib    Dispo: as above, ETT, and Dr. Almetta in 54mo, sooner if needed   Signed, Charlies Macario Arthur, PA-C   "

## 2024-03-13 ENCOUNTER — Ambulatory Visit: Attending: Physician Assistant | Admitting: Physician Assistant

## 2024-03-13 VITALS — BP 116/70 | HR 54 | Ht 69.0 in | Wt 254.0 lb

## 2024-03-13 DIAGNOSIS — Z5181 Encounter for therapeutic drug level monitoring: Secondary | ICD-10-CM

## 2024-03-13 DIAGNOSIS — I10 Essential (primary) hypertension: Secondary | ICD-10-CM

## 2024-03-13 DIAGNOSIS — Z79899 Other long term (current) drug therapy: Secondary | ICD-10-CM

## 2024-03-13 DIAGNOSIS — D6869 Other thrombophilia: Secondary | ICD-10-CM

## 2024-03-13 DIAGNOSIS — I480 Paroxysmal atrial fibrillation: Secondary | ICD-10-CM

## 2024-03-13 DIAGNOSIS — I484 Atypical atrial flutter: Secondary | ICD-10-CM

## 2024-03-13 NOTE — Patient Instructions (Signed)
 Medication Instructions:   Your physician recommends that you continue on your current medications as directed. Please refer to the Current Medication list given to you today.  *If you need a refill on your cardiac medications before your next appointment, please call your pharmacy*   Lab Work: NONE ORDERED  TODAY    If you have labs (blood work) drawn today and your tests are completely normal, you will receive your results only by: MyChart Message (if you have MyChart) OR A paper copy in the mail If you have any lab test that is abnormal or we need to change your treatment, we will call you to review the results.    Testing/Procedures: Your physician has requested that you have en exercise stress myoview. For further information please visit [email or web address] Please follow instruction sheet, as given.     Follow-Up: At [facility], you and your health needs are our priority.  As part of our continuing mission to provide you with exceptional heart care, our providers are all part of one team.  This team includes your primary Cardiologist (physician) and Advanced Practice Providers or APPs (Physician Assistants and Nurse Practitioners) who all work together to provide you with the care you need, when you need it.  Your next appointment:   6 month(s)   Provider:    Donnice Primus, MD or Charlies Arthur, PA-C   We recommend signing up for the patient portal called MyChart.  Sign up information is provided on this After Visit Summary.  MyChart is used to connect with patients for Virtual Visits (Telemedicine).  Patients are able to view lab/test results, encounter notes, upcoming appointments, etc.  Non-urgent messages can be sent to your provider as well.   To learn more about what you can do with MyChart, go to [email or web address].   Other Instructions

## 2024-05-23 ENCOUNTER — Encounter (HOSPITAL_COMMUNITY)
